# Patient Record
Sex: Male | Born: 1950 | Race: White | Hispanic: No | Marital: Married | Smoking: Never smoker
Health system: Southern US, Community
[De-identification: ages and names within clinical notes are randomized; demographics above are authoritative.]

## PROBLEM LIST (undated history)

## (undated) DIAGNOSIS — Z974 Presence of external hearing-aid: Secondary | ICD-10-CM

## (undated) DIAGNOSIS — E559 Vitamin D deficiency, unspecified: Secondary | ICD-10-CM

## (undated) DIAGNOSIS — G250 Essential tremor: Secondary | ICD-10-CM

## (undated) DIAGNOSIS — K4020 Bilateral inguinal hernia, without obstruction or gangrene, not specified as recurrent: Secondary | ICD-10-CM

## (undated) DIAGNOSIS — J3489 Other specified disorders of nose and nasal sinuses: Secondary | ICD-10-CM

## (undated) DIAGNOSIS — D510 Vitamin B12 deficiency anemia due to intrinsic factor deficiency: Secondary | ICD-10-CM

## (undated) DIAGNOSIS — E039 Hypothyroidism, unspecified: Secondary | ICD-10-CM

## (undated) DIAGNOSIS — Z973 Presence of spectacles and contact lenses: Secondary | ICD-10-CM

## (undated) DIAGNOSIS — I10 Essential (primary) hypertension: Secondary | ICD-10-CM

## (undated) DIAGNOSIS — J309 Allergic rhinitis, unspecified: Secondary | ICD-10-CM

## (undated) DIAGNOSIS — E538 Deficiency of other specified B group vitamins: Secondary | ICD-10-CM

## (undated) DIAGNOSIS — M1990 Unspecified osteoarthritis, unspecified site: Secondary | ICD-10-CM

## (undated) DIAGNOSIS — E785 Hyperlipidemia, unspecified: Secondary | ICD-10-CM

## (undated) DIAGNOSIS — M25511 Pain in right shoulder: Secondary | ICD-10-CM

## (undated) DIAGNOSIS — R202 Paresthesia of skin: Secondary | ICD-10-CM

## (undated) HISTORY — PX: CERVICAL SPINE SURGERY: SHX589

## (undated) HISTORY — DX: Hyperlipidemia, unspecified: E78.5

## (undated) HISTORY — DX: Vitamin B12 deficiency anemia due to intrinsic factor deficiency: D51.0

## (undated) HISTORY — DX: Unspecified osteoarthritis, unspecified site: M19.90

## (undated) HISTORY — DX: Allergic rhinitis, unspecified: J30.9

## (undated) HISTORY — PX: NASAL SINUS SURGERY: SHX719

## (undated) HISTORY — PX: LUMBAR SPINE SURGERY: SHX701

## (undated) HISTORY — DX: Paresthesia of skin: R20.2

## (undated) HISTORY — DX: Essential tremor: G25.0

## (undated) HISTORY — DX: Vitamin D deficiency, unspecified: E55.9

## (undated) HISTORY — DX: Deficiency of other specified B group vitamins: E53.8

## (undated) HISTORY — DX: Hypothyroidism, unspecified: E03.9

---

## 2002-01-01 ENCOUNTER — Ambulatory Visit (HOSPITAL_COMMUNITY): Admission: RE | Admit: 2002-01-01 | Discharge: 2002-01-01 | Payer: Self-pay | Admitting: *Deleted

## 2002-01-01 ENCOUNTER — Encounter: Payer: Self-pay | Admitting: *Deleted

## 2003-12-04 ENCOUNTER — Ambulatory Visit (HOSPITAL_COMMUNITY): Admission: RE | Admit: 2003-12-04 | Discharge: 2003-12-04 | Payer: Self-pay | Admitting: Family Medicine

## 2012-12-26 ENCOUNTER — Ambulatory Visit (INDEPENDENT_AMBULATORY_CARE_PROVIDER_SITE_OTHER): Payer: BC Managed Care – PPO | Admitting: Neurology

## 2012-12-26 ENCOUNTER — Encounter: Payer: Self-pay | Admitting: Neurology

## 2012-12-26 VITALS — BP 155/97 | HR 84 | Ht 73.5 in | Wt 174.0 lb

## 2012-12-26 DIAGNOSIS — F079 Unspecified personality and behavioral disorder due to known physiological condition: Secondary | ICD-10-CM | POA: Insufficient documentation

## 2012-12-26 DIAGNOSIS — G252 Other specified forms of tremor: Secondary | ICD-10-CM | POA: Insufficient documentation

## 2012-12-26 DIAGNOSIS — G250 Essential tremor: Secondary | ICD-10-CM

## 2012-12-26 MED ORDER — TOPIRAMATE 25 MG PO TABS: 25.0000 mg | ORAL_TABLET | Freq: Two times a day (BID) | ORAL | Status: DC

## 2012-12-26 NOTE — Progress Notes (Signed)
   Reason for visit: Tremor  Christian Coleman is an 62 y.o. male  History of present illness:  Christian Coleman is a 62 year old right-handed white male with a history of a benign essential tremor. The patient has been on Mysoline, but he is having troubles with dryness of the mouth, headaches, and constipation. The medication has helped the tremors slightly, but he is still having a lot of issues with tremor. The patient is having some drowsiness during the day when he is inactive. The patient also notes some slight gait imbalance. The patient returns to the office today for an evaluation. The patient has not noted any significant new medical issues, but he recently had a severe sinus infection. The patient is concerned that the medication may be causing some of these problems.   Past Medical History  Diagnosis Date  . Tremors of nervous system     Benign  . Hypothyroidism   . Vitamin B12 deficiency   . Vitamin D deficiency   . Auditory acuity evaluation     Decreased  . Hearing aid worn   . Dyslipidemia     Past Surgical History  Procedure Laterality Date  . Cervical spine surgery    . Lumbar spine surgery    . Nasal sinus surgery      Family History  Problem Relation Age of Onset  . Tremor Mother   . Heart disease Mother   . Cancer - Other Father     Throat  . Tremor Maternal Grandfather     Social history:  reports that he has never smoked. He does not have any smokeless tobacco history on file. He reports that he does not drink alcohol or use illicit drugs.  Allergies:  Allergies  Allergen Reactions  . Propranolol     Dizzy    Medications:  No current outpatient prescriptions on file prior to visit.   No current facility-administered medications on file prior to visit.    ROS:  Out of a complete 14 system review of symptoms, the patient complains only of the following symptoms, and all other reviewed systems are negative.  Tremor Sleepiness  Blood  pressure 155/97, pulse 84, height 6' 1.5" (1.867 m), weight 174 lb (78.926 kg).  Physical Exam  General: The patient is alert and cooperative at the time of the examination.  Skin: No significant peripheral edema is noted.   Neurologic Exam  Cranial nerves: Facial symmetry is present. Speech is normal, no aphasia or dysarthria is noted. Extraocular movements are full. Visual fields are full. The patient has some problems with a head and neck tremor.  Motor: The patient has good strength in all 4 extremities.  Coordination: The patient has good finger-nose-finger and heel-to-shin bilaterally. The patient has intention tremors with finger nose finger.  Gait and station: The patient has a normal gait. Tandem gait is normal. Romberg is negative. No drift is seen.  Reflexes: Deep tendon reflexes are symmetric.   Assessment/Plan:   One. Benign essential tremor  The patient is not tolerating a fairly low dose of Mysoline. The patient is on 150 mg at night. The patient will be switched to Topamax. The patient will taper off of the Mysoline. The patient will followup in 4 months. The patient will contact me if he has side effects on the new medication.  Marlan Palau MD 12/26/2012 8:45 PM  [facility] [address] [place], [place] [address]  Phone [phone] Fax [phone]

## 2012-12-26 NOTE — Patient Instructions (Signed)
  Topamax take one tablet at night for one week, then take one tablet twice a day. Taper off of the primidone.

## 2013-01-08 ENCOUNTER — Telehealth: Payer: Self-pay | Admitting: *Deleted

## 2013-01-08 MED ORDER — CLONAZEPAM 0.5 MG PO TABS: ORAL_TABLET | ORAL | Status: DC

## 2013-01-08 NOTE — Telephone Encounter (Signed)
I called patient. The patient is not tolerating the Topamax, he is to stop the drug. The patient will be started on low-dose clonazepam at 0.5 mg daily for 2 weeks, and then go to 1 tablet twice daily.

## 2013-01-08 NOTE — Telephone Encounter (Signed)
Patient called stating he took Topamax for 8 days but had to stop due to the side effects such as: tiredness, sleepiness on the job and unable to concrentrate.

## 2013-02-20 ENCOUNTER — Telehealth: Payer: Self-pay | Admitting: Neurology

## 2013-02-20 NOTE — Telephone Encounter (Signed)
Patient called stating he has taken himself off Clonazepam because he wants to try something natural because of the side effects. Patient will start taking Magnesium 300 mg

## 2013-02-20 NOTE — Telephone Encounter (Signed)
Noted. I did not call the patient back.

## 2013-05-16 ENCOUNTER — Ambulatory Visit: Payer: BC Managed Care – PPO | Admitting: Neurology

## 2016-01-11 ENCOUNTER — Telehealth: Payer: Self-pay | Admitting: Family Medicine

## 2016-01-11 ENCOUNTER — Ambulatory Visit (INDEPENDENT_AMBULATORY_CARE_PROVIDER_SITE_OTHER): Payer: 59 | Admitting: Family Medicine

## 2016-01-11 VITALS — BP 130/88 | HR 61 | Temp 98.4°F | Resp 18 | Ht 73.25 in | Wt 176.6 lb

## 2016-01-11 DIAGNOSIS — W57XXXA Bitten or stung by nonvenomous insect and other nonvenomous arthropods, initial encounter: Secondary | ICD-10-CM | POA: Diagnosis not present

## 2016-01-11 DIAGNOSIS — S30861A Insect bite (nonvenomous) of abdominal wall, initial encounter: Secondary | ICD-10-CM | POA: Diagnosis not present

## 2016-01-11 NOTE — Patient Instructions (Addendum)
IF you received an x-ray today, you will receive an invoice from [facility]. Please contact [facility] at [phone] with questions or concerns regarding your invoice.   IF you received labwork today, you will receive an invoice from [facility]. Please contact [facility] at [phone] with questions or concerns regarding your invoice.   Our billing staff will not be able to assist you with questions regarding bills from these companies.  You will be contacted with the lab results as soon as they are available. The fastest way to get your results is to activate your My Chart account. Instructions are located on the last page of this paperwork. If you have not heard from us regarding the results in 2 weeks, please contact this office.     I do not see any rash or concerning findings from where the tick was attached. If any fever, new rashes, or other new symptoms, be seen right away. See information below on Tenex. Continue to check body once per day if outside or around ticks. Return to the clinic or go to the nearest emergency room if any of your symptoms worsen or new symptoms occur.  Tick Bite Information Ticks are insects that attach themselves to the skin and draw blood for food. There are various types of ticks. Common types include wood ticks and deer ticks. Most ticks live in shrubs and grassy areas. Ticks can climb onto your body when you make contact with leaves or grass where the tick is waiting. The most common places on the body for ticks to attach themselves are the scalp, neck, armpits, waist, and groin. Most tick bites are harmless, but sometimes ticks carry germs that cause diseases. These germs can be spread to a person during the tick's feeding process. The chance of a disease spreading through a tick bite depends on:   The type of tick.  Time of year.   How long the tick is attached.   Geographic location.  HOW CAN  YOU PREVENT TICK BITES? Take these steps to help prevent tick bites when you are outdoors:  Wear protective clothing. Long sleeves and long pants are best.   Wear white clothes so you can see ticks more easily.  Tuck your pant legs into your socks.   If walking on a trail, stay in the middle of the trail to avoid brushing against bushes.  Avoid walking through areas with long grass.  Put insect repellent on all exposed skin and along boot tops, pant legs, and sleeve cuffs.   Check clothing, hair, and skin repeatedly and before going inside.   Brush off any ticks that are not attached.  Take a shower or bath as soon as possible after being outdoors.  WHAT IS THE PROPER WAY TO REMOVE A TICK? Ticks should be removed as soon as possible to help prevent diseases caused by tick bites. 1. If latex gloves are available, put them on before trying to remove a tick.  2. Using fine-point tweezers, grasp the tick as close to the skin as possible. You may also use curved forceps or a tick removal tool. Grasp the tick as close to its head as possible. Avoid grasping the tick on its body. 3. Pull gently with steady upward pressure until the tick lets go. Do not twist the tick or jerk it suddenly. This may break off the tick's head or mouth parts. 4. Do not squeeze or crush the tick's body. This could force  disease-carrying fluids from the tick into your body.  5. After the tick is removed, wash the bite area and your hands with soap and water or other disinfectant such as alcohol. 6. Apply a small amount of antiseptic cream or ointment to the bite site.  7. Wash and disinfect any instruments that were used.  Do not try to remove a tick by applying a hot match, petroleum jelly, or fingernail polish to the tick. These methods do not work and may increase the chances of disease being spread from the tick bite.  WHEN SHOULD YOU SEEK MEDICAL CARE? Contact your health care provider if you are  unable to remove a tick from your skin or if a part of the tick breaks off and is stuck in the skin.  After a tick bite, you need to be aware of signs and symptoms that could be related to diseases spread by ticks. Contact your health care provider if you develop any of the following in the days or weeks after the tick bite:  Unexplained fever.  Rash. A circular rash that appears days or weeks after the tick bite may indicate the possibility of Lyme disease. The rash may resemble a target with a bull's-eye and may occur at a different part of your body than the tick bite.  Redness and swelling in the area of the tick bite.   Tender, swollen lymph glands.   Diarrhea.   Weight loss.   Cough.   Fatigue.   Muscle, joint, or bone pain.   Abdominal pain.   Headache.   Lethargy or a change in your level of consciousness.  Difficulty walking or moving your legs.   Numbness in the legs.   Paralysis.  Shortness of breath.   Confusion.   Repeated vomiting.    This information is not intended to replace advice given to you by your health care provider. Make sure you discuss any questions you have with your health care provider.   Document Released: 09/02/2000 Document Revised: 09/26/2014 Document Reviewed: 02/13/2013 Elsevier Interactive Patient Education [employer]

## 2016-01-11 NOTE — Telephone Encounter (Signed)
Left message for patient that I will send his after visit summary, or he can return to pick it up if he would like.

## 2016-01-11 NOTE — Progress Notes (Signed)
By signing my name below I, Shelah Lewandowsky, attest that this documentation has been prepared under the direction and in the presence of Shade Flood, MD. Electonically Signed. Shelah Lewandowsky, Scribe 01/11/2016 at 11:14 AM  Subjective:    Patient ID: Christian Coleman, male    DOB: 1951-07-12, 65 y.o.   MRN: [id number]  Chief Complaint  Patient presents with  . Tick Removal    left side in groin area, noticed this morning.     HPI Christian Coleman is a 65 y.o. male who presents to the [facility] complaining of partial tick head still embedded in his upper left thigh. Pt states he noticed a small tick on his upper left thigh this morning and pulled it off and noticed a small black dot where the tick had been. Pt denies any rash, fever, or chills. Pt states that he was out in the woods 2 days ago and found multiple ticks on a family member 2 days ago after the trip into the woods.  Pt states that he showers daily and states he definitely did not have the tick on him yesterday.    Patient Active Problem List   Diagnosis Date Noted  . 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 Essential and other specified forms of tremor 12/26/2012   Past Medical History  Diagnosis Date  . Tremors of nervous system     Benign  . Hypothyroidism   . Vitamin B12 deficiency   . Vitamin D deficiency   . Auditory acuity evaluation     Decreased  . Hearing aid worn   . Dyslipidemia   . Allergy   . Arthritis    Past Surgical History  Procedure Laterality Date  . Cervical spine surgery    . Lumbar spine surgery    . Nasal sinus surgery    . Spine surgery     Allergies  Allergen Reactions  . Propranolol     Dizzy   Prior to Admission medications   Medication Sig Start Date End Date Taking? Authorizing Provider  aspirin 81 MG tablet Take 81 mg by mouth daily.   Yes Historical Provider, MD  atorvastatin (LIPITOR) 20 MG tablet  Take 20 mg by mouth daily. 12/02/12  Yes Historical Provider, MD  Cholecalciferol (VITAMIN D3) 1000 UNITS CAPS Take 1,000 capsules by mouth 2 (two) times daily.   Yes Historical Provider, MD  Coenzyme Q10 (COQ-10 PO) Take by mouth.   Yes Historical Provider, MD  levothyroxine (SYNTHROID, LEVOTHROID) 175 MCG tablet Take 175 mcg by mouth daily. 12/04/12  Yes Historical Provider, MD  vitamin B-12 (CYANOCOBALAMIN) 1000 MCG tablet Place 1,000 mcg under the tongue daily.   Yes Historical Provider, MD   Social History   Social History  . Marital Status: Married    Spouse Name: N/A  . Number of Children: N/A  . Years of Education: N/A   Occupational History  . Not on file.   Social History Main Topics  . Smoking status: Never Smoker   . Smokeless tobacco: Not on file  . Alcohol Use: No  . Drug Use: No  . Sexual Activity: Not on file   Other Topics Concern  . Not on file   Social History Narrative      Review of Systems  Constitutional: Negative for fever and chills.  Skin: Negative for rash.       Objective:   Physical Exam  Constitutional: He is  oriented to person, place, and time. He appears well-developed and well-nourished. No distress.  HENT:  Head: Normocephalic and atraumatic.  Eyes: Conjunctivae are normal. Pupils are equal, round, and reactive to light.  Neck: Neck supple.  Cardiovascular: Normal rate.   Pulmonary/Chest: Effort normal.  Musculoskeletal: Normal range of motion.  Neurological: He is alert and oriented to person, place, and time. Gait normal.  Skin: Skin is warm and dry. Abrasion (In the left lower abd anterior hip area there is a small abrasion and dried blood centrally from tick removal.) noted.  There is no surrounding erythema, no discharge, and no swelling around the abrasion in the left lower abd anterior hip area.  Psychiatric: He has a normal mood and affect. His behavior is normal.  Nursing note and vitals reviewed.    Filed Vitals:    01/11/16 1020  BP: 130/88  Pulse: 61  Temp: 98.4 F (36.9 C)  TempSrc: Oral  Resp: 18  Height: 6' 1.25" (1.861 m)  Weight: 176 lb 9.6 oz (80.105 kg)  SpO2: 97%        Assessment & Plan:    Christian Coleman is a 65 y.o. male Tick bite of abdomen, initial encounter  - On close exam, appears that all tick parts have been removed. Second exam was provided by other provider. As tick was attached for less than 48 hours, unlikely to have disease transmission. No rash, no other concerning findings on history or exam. RTC precautions discussed if fever, rash, or other new symptoms.  Meds ordered this encounter  Medications  . Coenzyme Q10 (COQ-10 PO)    Sig: Take by mouth.   Patient Instructions       IF you received an x-ray today, you will receive an invoice from [facility]. Please contact [facility] at [phone] with questions or concerns regarding your invoice.   IF you received labwork today, you will receive an invoice from [facility]. Please contact [facility] at [phone] with questions or concerns regarding your invoice.   Our billing staff will not be able to assist you with questions regarding bills from these companies.  You will be contacted with the lab results as soon as they are available. The fastest way to get your results is to activate your My Chart account. Instructions are located on the last page of this paperwork. If you have not heard from us regarding the results in 2 weeks, please contact this office.     I do not see any rash or concerning findings from where the tick was attached. If any fever, new rashes, or other new symptoms, be seen right away. See information below on Tenex. Continue to check body once per day if outside or around ticks. Return to the clinic or go to the nearest emergency room if any of your symptoms worsen or new symptoms occur.  Tick Bite Information Ticks are insects that  attach themselves to the skin and draw blood for food. There are various types of ticks. Common types include wood ticks and deer ticks. Most ticks live in shrubs and grassy areas. Ticks can climb onto your body when you make contact with leaves or grass where the tick is waiting. The most common places on the body for ticks to attach themselves are the scalp, neck, armpits, waist, and groin. Most tick bites are harmless, but sometimes ticks carry germs that cause diseases. These germs can be spread to a person during the tick's feeding process. The  chance of a disease spreading through a tick bite depends on:   The type of tick.  Time of year.   How long the tick is attached.   Geographic location.  HOW CAN YOU PREVENT TICK BITES? Take these steps to help prevent tick bites when you are outdoors:  Wear protective clothing. Long sleeves and long pants are best.   Wear white clothes so you can see ticks more easily.  Tuck your pant legs into your socks.   If walking on a trail, stay in the middle of the trail to avoid brushing against bushes.  Avoid walking through areas with long grass.  Put insect repellent on all exposed skin and along boot tops, pant legs, and sleeve cuffs.   Check clothing, hair, and skin repeatedly and before going inside.   Brush off any ticks that are not attached.  Take a shower or bath as soon as possible after being outdoors.  WHAT IS THE PROPER WAY TO REMOVE A TICK? Ticks should be removed as soon as possible to help prevent diseases caused by tick bites. 1. If latex gloves are available, put them on before trying to remove a tick.  2. Using fine-point tweezers, grasp the tick as close to the skin as possible. You may also use curved forceps or a tick removal tool. Grasp the tick as close to its head as possible. Avoid grasping the tick on its body. 3. Pull gently with steady upward pressure until the tick lets go. Do not twist the tick or jerk  it suddenly. This may break off the tick's head or mouth parts. 4. Do not squeeze or crush the tick's body. This could force disease-carrying fluids from the tick into your body.  5. After the tick is removed, wash the bite area and your hands with soap and water or other disinfectant such as alcohol. 6. Apply a small amount of antiseptic cream or ointment to the bite site.  7. Wash and disinfect any instruments that were used.  Do not try to remove a tick by applying a hot match, petroleum jelly, or fingernail polish to the tick. These methods do not work and may increase the chances of disease being spread from the tick bite.  WHEN SHOULD YOU SEEK MEDICAL CARE? Contact your health care provider if you are unable to remove a tick from your skin or if a part of the tick breaks off and is stuck in the skin.  After a tick bite, you need to be aware of signs and symptoms that could be related to diseases spread by ticks. Contact your health care provider if you develop any of the following in the days or weeks after the tick bite:  Unexplained fever.  Rash. A circular rash that appears days or weeks after the tick bite may indicate the possibility of Lyme disease. The rash may resemble a target with a bull's-eye and may occur at a different part of your body than the tick bite.  Redness and swelling in the area of the tick bite.   Tender, swollen lymph glands.   Diarrhea.   Weight loss.   Cough.   Fatigue.   Muscle, joint, or bone pain.   Abdominal pain.   Headache.   Lethargy or a change in your level of consciousness.  Difficulty walking or moving your legs.   Numbness in the legs.   Paralysis.  Shortness of breath.   Confusion.   Repeated vomiting.    This  information is not intended to replace advice given to you by your health care provider. Make sure you discuss any questions you have with your health care provider.   Document Released: 09/02/2000  Document Revised: 09/26/2014 Document Reviewed: 02/13/2013 Elsevier Interactive Patient Education [employer]     I personally performed the services described in this documentation, which was scribed in my presence. The recorded information has been reviewed and considered, and addended by me as needed.

## 2016-06-07 DIAGNOSIS — R202 Paresthesia of skin: Secondary | ICD-10-CM | POA: Diagnosis not present

## 2016-06-07 DIAGNOSIS — G250 Essential tremor: Secondary | ICD-10-CM | POA: Diagnosis not present

## 2016-06-07 DIAGNOSIS — E559 Vitamin D deficiency, unspecified: Secondary | ICD-10-CM | POA: Diagnosis not present

## 2016-06-07 DIAGNOSIS — I10 Essential (primary) hypertension: Secondary | ICD-10-CM | POA: Diagnosis not present

## 2016-06-07 DIAGNOSIS — M25511 Pain in right shoulder: Secondary | ICD-10-CM | POA: Diagnosis not present

## 2016-06-07 DIAGNOSIS — J309 Allergic rhinitis, unspecified: Secondary | ICD-10-CM | POA: Diagnosis not present

## 2016-06-07 DIAGNOSIS — D510 Vitamin B12 deficiency anemia due to intrinsic factor deficiency: Secondary | ICD-10-CM | POA: Diagnosis not present

## 2016-06-07 DIAGNOSIS — E039 Hypothyroidism, unspecified: Secondary | ICD-10-CM | POA: Diagnosis not present

## 2016-06-07 DIAGNOSIS — E782 Mixed hyperlipidemia: Secondary | ICD-10-CM | POA: Diagnosis not present

## 2016-06-07 DIAGNOSIS — B070 Plantar wart: Secondary | ICD-10-CM | POA: Diagnosis not present

## 2016-06-08 ENCOUNTER — Ambulatory Visit (INDEPENDENT_AMBULATORY_CARE_PROVIDER_SITE_OTHER): Payer: Medicare HMO | Admitting: Podiatry

## 2016-06-08 DIAGNOSIS — B070 Plantar wart: Secondary | ICD-10-CM

## 2016-06-08 DIAGNOSIS — M79671 Pain in right foot: Secondary | ICD-10-CM | POA: Diagnosis not present

## 2016-06-08 NOTE — Progress Notes (Signed)
   Subjective:    Patient ID: Christian Coleman, male    DOB: 11-19-1950, 65 y.o.   MRN: [id number]  HPI    Review of Systems  All other systems reviewed and are negative.      Objective:   Physical Exam        Assessment & Plan:

## 2016-06-08 NOTE — Patient Instructions (Signed)
Plantar Warts Warts are small growths on the skin. They can occur on various areas of the body. When they occur on the underside (sole) of the foot, they are called plantar warts. Plantar warts often occur in groups, with several small warts around a larger growth. They tend to develop over areas of pressure, such as the heel or the ball of the foot. Most warts are not painful, and they usually do not cause problems. However, plantar warts may cause pain when you walk because pressure is applied to them. Warts often go away on their own in time. Various treatments may be done if needed. Sometimes, warts go away and then they come back again. CAUSES Plantar warts are caused by a type of virus that is called human papillomavirus (HPV). HPV attacks a break in the skin of the foot. Walking barefoot can lead to exposure to the virus. These warts may spread to other areas of the sole. They spread to other areas of the body only through direct contact. RISK FACTORS Plantar warts are more likely to develop in:  People who are 10-20 years of age.  People who use public showers or locker rooms.  People who have a weakened body defense system (immune system). SYMPTOMS Plantar warts may be flat or slightly raised. They may grow into the deeper layers of skin or rise above the surface of the skin. Most plantar warts have a rough surface. They may cause pain when you use your foot to support your body weight. DIAGNOSIS A plantar wart can usually be diagnosed from its appearance. In some cases, a tissue sample may be removed (biopsy) to be looked at under a microscope. TREATMENT In many cases, warts do not need treatment. Without treatment, they often go away over a period of many months to a couple years. If treatment is needed, options may include:  Applying medicated solutions, creams, or patches to the wart. These may be over-the-counter or prescription medicines that make the skin soft so that layers will  gradually shed away. In many cases, the medicine is applied one or two times per day and covered with a bandage.  Putting duct tape over the top of the wart (occlusion). You will leave the tape in place for as long as told by your health care provider, then you will replace it with a new strip of tape. This is done until the wart goes away.  Freezing the wart with liquid nitrogen (cryotherapy).  Burning the wart with:  Laser treatment.  An electrified probe (electrocautery).  Injection of a medicine (Candida antigen) into the wart to help the body's immune system to fight off the wart.  Surgery to remove the wart. HOME CARE INSTRUCTIONS  Apply medicated creams or solutions only as told by your health care provider. This may involve:  Soaking the affected area in warm water.  Removing the top layer of softened skin before you apply the medicine. A pumice stone works well for removing the tissue.  Applying a bandage over the affected area after you apply the medicine.  Repeating the process daily or as told by your health care provider.  Do not scratch or pick at a wart.  Wash your hands after you touch a wart.  If a wart is painful, try applying a bandage with a hole in the middle over the wart. The helps to take pressure off the wart.  Keep all follow-up visits as told by your health care provider. This is important. PREVENTION   Take these actions to help prevent warts:  Wear shoes and socks. Change your socks daily.  Keep your feet clean and dry.  Check your feet regularly.  Avoid direct contact with warts on other people. SEEK MEDICAL CARE IF:  Your warts do not improve after treatment.  You have redness, swelling, or pain at the site of a wart.  You have bleeding from a wart that does not stop with light pressure.  You have diabetes and you develop a wart.   This information is not intended to replace advice given to you by your health care provider. Make sure  you discuss any questions you have with your health care provider.   Document Released: 11/26/2003 Document Revised: 05/27/2015 Document Reviewed: 12/01/2014 Elsevier Interactive Patient Education 2016 [employer]  

## 2016-06-12 NOTE — Progress Notes (Signed)
Patient ID: Christian Coleman, male   DOB: July 08, 1951, 65 y.o.   MRN: [id number] Subjective: Patient presents today with pain and tenderness on the plantar aspect of the right foot secondary to a plantars wart. Patient states that the pain has been present for several weeks now. Patient denies trauma.  Objective: Physical Exam General: The patient is alert and oriented x3 in no acute distress.  Dermatology: Hyperkeratotic skin lesion noted to the plantar aspect of the right foot approximately 1 cm in diameter.. Pinpoint bleeding noted upon debridement. Skin is warm, dry and supple bilateral lower extremities. Negative for open lesions or macerations.  Vascular: Palpable pedal pulses bilaterally. No edema or erythema noted. Capillary refill within normal limits.  Neurological: Epicritic and protective threshold grossly intact bilaterally.   Musculoskeletal Exam: Pain on palpation to the note skin lesion.  Range of motion within normal limits to all pedal and ankle joints bilateral. Muscle strength 5/5 in all groups bilateral.   Assessment: #1 plantar wart right foot #2 pain in right foot  Problem List Items Addressed This Visit    None    Visit Diagnoses   None.       Plan of Care:  #1 Patient was evaluated. #2 Excisional debridement of the plantar wart lesion was performed using a chisel blade. Cantharone was applied and the lesion was dressed with a dry sterile dressing. #3 patient is to return to clinic in 1 week.  Next visit 17110     Dr. Felecia ShellingBrent M. Latarshia Jersey, DPM [facility]

## 2016-06-16 ENCOUNTER — Ambulatory Visit (INDEPENDENT_AMBULATORY_CARE_PROVIDER_SITE_OTHER): Payer: Medicare HMO | Admitting: Podiatry

## 2016-06-16 DIAGNOSIS — B070 Plantar wart: Secondary | ICD-10-CM

## 2016-06-16 DIAGNOSIS — M79671 Pain in right foot: Secondary | ICD-10-CM

## 2016-06-18 NOTE — Progress Notes (Signed)
Subjective: Patient presents today for follow-up evaluation of a painful plantar wart.Patient states that he's doing well. Cantharone was applied on the last visit.  Objective: Physical Exam General: The patient is alert and oriented x3 in no acute distress.  Dermatology: Hyperkeratotic skin lesion noted to the plantar aspect of the foot approximately 1 cm in diameter. Skin is warm, dry and supple bilateral lower extremities. Negative for open lesions or macerations.  Vascular: Palpable pedal pulses bilaterally. No edema or erythema noted. Capillary refill within normal limits.  Neurological: Epicritic and protective threshold grossly intact bilaterally.   Musculoskeletal Exam: Pain on palpation to the note skin lesion.  Range of motion within normal limits to all pedal and ankle joints bilateral. Muscle strength 5/5 in all groups bilateral.   Assessment: #1 plantar wart right foot #2 pain in right foot   Plan of Care:  #1 Patient was evaluated. #2 Excisional debridement of the plantar wart lesion was performed using a chisel blade. Salicylic acid was applied and the lesion was dressed with a dry sterile dressing. #3 patient is to return to clinic when necessary    Dr. Felecia ShellingBrent M. Kirstie Larsen, DPM [facility]

## 2016-08-16 DIAGNOSIS — M7581 Other shoulder lesions, right shoulder: Secondary | ICD-10-CM | POA: Diagnosis not present

## 2016-08-17 ENCOUNTER — Other Ambulatory Visit: Payer: Self-pay | Admitting: Family Medicine

## 2016-08-17 DIAGNOSIS — M7581 Other shoulder lesions, right shoulder: Secondary | ICD-10-CM

## 2016-08-19 ENCOUNTER — Ambulatory Visit
Admission: RE | Admit: 2016-08-19 | Discharge: 2016-08-19 | Disposition: A | Discharge status: Home / Self Care | Payer: Medicare HMO | Source: Ambulatory Visit | Attending: Family Medicine | Admitting: Family Medicine

## 2016-08-19 ENCOUNTER — Other Ambulatory Visit: Payer: Self-pay | Admitting: Family Medicine

## 2016-08-19 DIAGNOSIS — M25511 Pain in right shoulder: Secondary | ICD-10-CM

## 2016-08-21 ENCOUNTER — Inpatient Hospital Stay: Admission: RE | Admit: 2016-08-21 | Payer: Self-pay | Source: Ambulatory Visit

## 2016-08-26 ENCOUNTER — Ambulatory Visit
Admission: RE | Admit: 2016-08-26 | Discharge: 2016-08-26 | Disposition: A | Discharge status: Home / Self Care | Payer: Medicare HMO | Source: Ambulatory Visit | Attending: Family Medicine | Admitting: Family Medicine

## 2016-08-26 DIAGNOSIS — M7581 Other shoulder lesions, right shoulder: Secondary | ICD-10-CM

## 2016-08-26 DIAGNOSIS — M25511 Pain in right shoulder: Secondary | ICD-10-CM | POA: Diagnosis not present

## 2016-08-29 DIAGNOSIS — J309 Allergic rhinitis, unspecified: Secondary | ICD-10-CM | POA: Diagnosis not present

## 2016-08-29 DIAGNOSIS — G250 Essential tremor: Secondary | ICD-10-CM | POA: Diagnosis not present

## 2016-08-29 DIAGNOSIS — E559 Vitamin D deficiency, unspecified: Secondary | ICD-10-CM | POA: Diagnosis not present

## 2016-08-29 DIAGNOSIS — Z23 Encounter for immunization: Secondary | ICD-10-CM | POA: Diagnosis not present

## 2016-08-29 DIAGNOSIS — I10 Essential (primary) hypertension: Secondary | ICD-10-CM | POA: Diagnosis not present

## 2016-08-29 DIAGNOSIS — M7581 Other shoulder lesions, right shoulder: Secondary | ICD-10-CM | POA: Diagnosis not present

## 2016-08-29 DIAGNOSIS — E039 Hypothyroidism, unspecified: Secondary | ICD-10-CM | POA: Diagnosis not present

## 2016-08-29 DIAGNOSIS — E782 Mixed hyperlipidemia: Secondary | ICD-10-CM | POA: Diagnosis not present

## 2016-08-29 DIAGNOSIS — D510 Vitamin B12 deficiency anemia due to intrinsic factor deficiency: Secondary | ICD-10-CM | POA: Diagnosis not present

## 2016-08-30 DIAGNOSIS — Z6824 Body mass index (BMI) 24.0-24.9, adult: Secondary | ICD-10-CM | POA: Diagnosis not present

## 2016-08-30 DIAGNOSIS — E039 Hypothyroidism, unspecified: Secondary | ICD-10-CM | POA: Diagnosis not present

## 2016-08-30 DIAGNOSIS — E785 Hyperlipidemia, unspecified: Secondary | ICD-10-CM | POA: Diagnosis not present

## 2016-08-30 DIAGNOSIS — I10 Essential (primary) hypertension: Secondary | ICD-10-CM | POA: Diagnosis not present

## 2016-08-30 DIAGNOSIS — Z0000 Encounter for general adult medical examination without abnormal findings: Secondary | ICD-10-CM | POA: Diagnosis not present

## 2016-09-02 DIAGNOSIS — M19011 Primary osteoarthritis, right shoulder: Secondary | ICD-10-CM | POA: Diagnosis not present

## 2016-09-02 DIAGNOSIS — M67911 Unspecified disorder of synovium and tendon, right shoulder: Secondary | ICD-10-CM | POA: Diagnosis not present

## 2016-09-30 DIAGNOSIS — M19011 Primary osteoarthritis, right shoulder: Secondary | ICD-10-CM | POA: Diagnosis not present

## 2016-10-14 DIAGNOSIS — R0981 Nasal congestion: Secondary | ICD-10-CM | POA: Diagnosis not present

## 2016-10-18 DIAGNOSIS — M19011 Primary osteoarthritis, right shoulder: Secondary | ICD-10-CM | POA: Diagnosis not present

## 2016-10-18 DIAGNOSIS — M75111 Incomplete rotator cuff tear or rupture of right shoulder, not specified as traumatic: Secondary | ICD-10-CM | POA: Diagnosis not present

## 2016-10-18 DIAGNOSIS — M24111 Other articular cartilage disorders, right shoulder: Secondary | ICD-10-CM | POA: Diagnosis not present

## 2016-10-18 DIAGNOSIS — G8918 Other acute postprocedural pain: Secondary | ICD-10-CM | POA: Diagnosis not present

## 2016-10-18 DIAGNOSIS — M94211 Chondromalacia, right shoulder: Secondary | ICD-10-CM | POA: Diagnosis not present

## 2016-10-18 DIAGNOSIS — S43491D Other sprain of right shoulder joint, subsequent encounter: Secondary | ICD-10-CM | POA: Diagnosis not present

## 2016-10-18 DIAGNOSIS — M7541 Impingement syndrome of right shoulder: Secondary | ICD-10-CM | POA: Diagnosis not present

## 2016-10-18 HISTORY — PX: SHOULDER OPEN ROTATOR CUFF REPAIR: SHX2407

## 2016-10-26 DIAGNOSIS — Z9889 Other specified postprocedural states: Secondary | ICD-10-CM | POA: Diagnosis not present

## 2016-10-27 DIAGNOSIS — M25511 Pain in right shoulder: Secondary | ICD-10-CM | POA: Diagnosis not present

## 2016-10-27 DIAGNOSIS — M25611 Stiffness of right shoulder, not elsewhere classified: Secondary | ICD-10-CM | POA: Diagnosis not present

## 2016-11-01 DIAGNOSIS — M25511 Pain in right shoulder: Secondary | ICD-10-CM | POA: Diagnosis not present

## 2016-11-01 DIAGNOSIS — M25611 Stiffness of right shoulder, not elsewhere classified: Secondary | ICD-10-CM | POA: Diagnosis not present

## 2016-11-03 DIAGNOSIS — M25511 Pain in right shoulder: Secondary | ICD-10-CM | POA: Diagnosis not present

## 2016-11-03 DIAGNOSIS — M25611 Stiffness of right shoulder, not elsewhere classified: Secondary | ICD-10-CM | POA: Diagnosis not present

## 2016-11-07 DIAGNOSIS — M25611 Stiffness of right shoulder, not elsewhere classified: Secondary | ICD-10-CM | POA: Diagnosis not present

## 2016-11-07 DIAGNOSIS — M25511 Pain in right shoulder: Secondary | ICD-10-CM | POA: Diagnosis not present

## 2016-11-10 DIAGNOSIS — M25611 Stiffness of right shoulder, not elsewhere classified: Secondary | ICD-10-CM | POA: Diagnosis not present

## 2016-11-10 DIAGNOSIS — M25511 Pain in right shoulder: Secondary | ICD-10-CM | POA: Diagnosis not present

## 2016-11-14 DIAGNOSIS — M25611 Stiffness of right shoulder, not elsewhere classified: Secondary | ICD-10-CM | POA: Diagnosis not present

## 2016-11-14 DIAGNOSIS — M25511 Pain in right shoulder: Secondary | ICD-10-CM | POA: Diagnosis not present

## 2016-11-16 DIAGNOSIS — M25511 Pain in right shoulder: Secondary | ICD-10-CM | POA: Diagnosis not present

## 2016-11-16 DIAGNOSIS — M25611 Stiffness of right shoulder, not elsewhere classified: Secondary | ICD-10-CM | POA: Diagnosis not present

## 2016-11-22 DIAGNOSIS — M25511 Pain in right shoulder: Secondary | ICD-10-CM | POA: Diagnosis not present

## 2016-11-22 DIAGNOSIS — M25611 Stiffness of right shoulder, not elsewhere classified: Secondary | ICD-10-CM | POA: Diagnosis not present

## 2016-11-24 DIAGNOSIS — M25511 Pain in right shoulder: Secondary | ICD-10-CM | POA: Diagnosis not present

## 2016-11-24 DIAGNOSIS — M25611 Stiffness of right shoulder, not elsewhere classified: Secondary | ICD-10-CM | POA: Diagnosis not present

## 2016-11-28 DIAGNOSIS — M25511 Pain in right shoulder: Secondary | ICD-10-CM | POA: Diagnosis not present

## 2016-11-28 DIAGNOSIS — M25611 Stiffness of right shoulder, not elsewhere classified: Secondary | ICD-10-CM | POA: Diagnosis not present

## 2016-11-30 DIAGNOSIS — Z9889 Other specified postprocedural states: Secondary | ICD-10-CM | POA: Diagnosis not present

## 2016-12-01 DIAGNOSIS — M25611 Stiffness of right shoulder, not elsewhere classified: Secondary | ICD-10-CM | POA: Diagnosis not present

## 2016-12-01 DIAGNOSIS — M25511 Pain in right shoulder: Secondary | ICD-10-CM | POA: Diagnosis not present

## 2016-12-05 DIAGNOSIS — M25611 Stiffness of right shoulder, not elsewhere classified: Secondary | ICD-10-CM | POA: Diagnosis not present

## 2016-12-05 DIAGNOSIS — M25511 Pain in right shoulder: Secondary | ICD-10-CM | POA: Diagnosis not present

## 2016-12-07 DIAGNOSIS — M25611 Stiffness of right shoulder, not elsewhere classified: Secondary | ICD-10-CM | POA: Diagnosis not present

## 2016-12-07 DIAGNOSIS — M25511 Pain in right shoulder: Secondary | ICD-10-CM | POA: Diagnosis not present

## 2016-12-12 DIAGNOSIS — M25611 Stiffness of right shoulder, not elsewhere classified: Secondary | ICD-10-CM | POA: Diagnosis not present

## 2016-12-12 DIAGNOSIS — M25511 Pain in right shoulder: Secondary | ICD-10-CM | POA: Diagnosis not present

## 2016-12-15 DIAGNOSIS — M25611 Stiffness of right shoulder, not elsewhere classified: Secondary | ICD-10-CM | POA: Diagnosis not present

## 2016-12-15 DIAGNOSIS — M25511 Pain in right shoulder: Secondary | ICD-10-CM | POA: Diagnosis not present

## 2016-12-19 DIAGNOSIS — M25511 Pain in right shoulder: Secondary | ICD-10-CM | POA: Diagnosis not present

## 2016-12-19 DIAGNOSIS — M25611 Stiffness of right shoulder, not elsewhere classified: Secondary | ICD-10-CM | POA: Diagnosis not present

## 2016-12-23 DIAGNOSIS — M25611 Stiffness of right shoulder, not elsewhere classified: Secondary | ICD-10-CM | POA: Diagnosis not present

## 2016-12-23 DIAGNOSIS — M25511 Pain in right shoulder: Secondary | ICD-10-CM | POA: Diagnosis not present

## 2016-12-26 DIAGNOSIS — M25511 Pain in right shoulder: Secondary | ICD-10-CM | POA: Diagnosis not present

## 2016-12-26 DIAGNOSIS — M25611 Stiffness of right shoulder, not elsewhere classified: Secondary | ICD-10-CM | POA: Diagnosis not present

## 2016-12-28 DIAGNOSIS — M25511 Pain in right shoulder: Secondary | ICD-10-CM | POA: Diagnosis not present

## 2016-12-28 DIAGNOSIS — M25611 Stiffness of right shoulder, not elsewhere classified: Secondary | ICD-10-CM | POA: Diagnosis not present

## 2017-01-02 DIAGNOSIS — M25511 Pain in right shoulder: Secondary | ICD-10-CM | POA: Diagnosis not present

## 2017-01-02 DIAGNOSIS — M25611 Stiffness of right shoulder, not elsewhere classified: Secondary | ICD-10-CM | POA: Diagnosis not present

## 2017-01-04 DIAGNOSIS — M25511 Pain in right shoulder: Secondary | ICD-10-CM | POA: Diagnosis not present

## 2017-01-04 DIAGNOSIS — M25611 Stiffness of right shoulder, not elsewhere classified: Secondary | ICD-10-CM | POA: Diagnosis not present

## 2017-01-09 DIAGNOSIS — M25511 Pain in right shoulder: Secondary | ICD-10-CM | POA: Diagnosis not present

## 2017-01-09 DIAGNOSIS — M25611 Stiffness of right shoulder, not elsewhere classified: Secondary | ICD-10-CM | POA: Diagnosis not present

## 2017-01-11 DIAGNOSIS — Z9889 Other specified postprocedural states: Secondary | ICD-10-CM | POA: Diagnosis not present

## 2017-01-11 DIAGNOSIS — M25611 Stiffness of right shoulder, not elsewhere classified: Secondary | ICD-10-CM | POA: Diagnosis not present

## 2017-01-11 DIAGNOSIS — M25511 Pain in right shoulder: Secondary | ICD-10-CM | POA: Diagnosis not present

## 2017-04-14 DIAGNOSIS — M25511 Pain in right shoulder: Secondary | ICD-10-CM | POA: Diagnosis not present

## 2017-05-02 DIAGNOSIS — E559 Vitamin D deficiency, unspecified: Secondary | ICD-10-CM | POA: Diagnosis not present

## 2017-05-02 DIAGNOSIS — J309 Allergic rhinitis, unspecified: Secondary | ICD-10-CM | POA: Diagnosis not present

## 2017-05-02 DIAGNOSIS — M7581 Other shoulder lesions, right shoulder: Secondary | ICD-10-CM | POA: Diagnosis not present

## 2017-05-02 DIAGNOSIS — I10 Essential (primary) hypertension: Secondary | ICD-10-CM | POA: Diagnosis not present

## 2017-05-02 DIAGNOSIS — E782 Mixed hyperlipidemia: Secondary | ICD-10-CM | POA: Diagnosis not present

## 2017-05-02 DIAGNOSIS — D510 Vitamin B12 deficiency anemia due to intrinsic factor deficiency: Secondary | ICD-10-CM | POA: Diagnosis not present

## 2017-05-02 DIAGNOSIS — Z1389 Encounter for screening for other disorder: Secondary | ICD-10-CM | POA: Diagnosis not present

## 2017-05-02 DIAGNOSIS — E039 Hypothyroidism, unspecified: Secondary | ICD-10-CM | POA: Diagnosis not present

## 2017-05-02 DIAGNOSIS — G250 Essential tremor: Secondary | ICD-10-CM | POA: Diagnosis not present

## 2017-11-07 DIAGNOSIS — E559 Vitamin D deficiency, unspecified: Secondary | ICD-10-CM | POA: Diagnosis not present

## 2017-11-07 DIAGNOSIS — G250 Essential tremor: Secondary | ICD-10-CM | POA: Diagnosis not present

## 2017-11-07 DIAGNOSIS — K4020 Bilateral inguinal hernia, without obstruction or gangrene, not specified as recurrent: Secondary | ICD-10-CM | POA: Diagnosis not present

## 2017-11-07 DIAGNOSIS — D510 Vitamin B12 deficiency anemia due to intrinsic factor deficiency: Secondary | ICD-10-CM | POA: Diagnosis not present

## 2017-11-07 DIAGNOSIS — E782 Mixed hyperlipidemia: Secondary | ICD-10-CM | POA: Diagnosis not present

## 2017-11-07 DIAGNOSIS — I10 Essential (primary) hypertension: Secondary | ICD-10-CM | POA: Diagnosis not present

## 2017-11-07 DIAGNOSIS — Z1159 Encounter for screening for other viral diseases: Secondary | ICD-10-CM | POA: Diagnosis not present

## 2017-11-07 DIAGNOSIS — E039 Hypothyroidism, unspecified: Secondary | ICD-10-CM | POA: Diagnosis not present

## 2017-11-07 DIAGNOSIS — M25511 Pain in right shoulder: Secondary | ICD-10-CM | POA: Diagnosis not present

## 2017-11-07 DIAGNOSIS — Z0000 Encounter for general adult medical examination without abnormal findings: Secondary | ICD-10-CM | POA: Diagnosis not present

## 2017-11-08 DIAGNOSIS — Z1211 Encounter for screening for malignant neoplasm of colon: Secondary | ICD-10-CM | POA: Diagnosis not present

## 2017-11-24 DIAGNOSIS — K4020 Bilateral inguinal hernia, without obstruction or gangrene, not specified as recurrent: Secondary | ICD-10-CM | POA: Diagnosis not present

## 2017-11-30 ENCOUNTER — Ambulatory Visit: Payer: Self-pay | Admitting: General Surgery

## 2017-12-28 ENCOUNTER — Encounter (HOSPITAL_BASED_OUTPATIENT_CLINIC_OR_DEPARTMENT_OTHER): Payer: Self-pay | Admitting: *Deleted

## 2018-01-04 ENCOUNTER — Other Ambulatory Visit: Payer: Self-pay

## 2018-01-04 ENCOUNTER — Encounter (HOSPITAL_BASED_OUTPATIENT_CLINIC_OR_DEPARTMENT_OTHER): Payer: Self-pay | Admitting: *Deleted

## 2018-01-04 NOTE — Progress Notes (Signed)
Your procedure is scheduled on  ____04-24-2019________  [facility]  6:30 A. M._____   Call this number if you have problems the morning of surgery  :[phone].   OUR ADDRESS IS [address].  WE ARE LOCATED IN [facility].                                     REMEMBER:  DO NOT EAT FOOD AFTER MIDNIGHT  WITH EXCEPTION CLEAR LIQUID DIET UNTIL 5:30 AM (SEE BELOW INSTRUCTIONS).   TAKE THESE MEDICATIONS MORNING OF SURGERY WITH A SIP OF WATER:  ______SYNTHROID, LIPITOR, B12                                    DO NOT WEAR JEWERLY.   DO NOT WEAR LOTIONS, POWDERS, PERFUMES OR DEODORANT.  MEN MAY SHAVE FACE AND NECK.  CONTACTS, GLASSES, OR DENTURES MAY NOT BE WORN TO SURGERY.                                    Christian Coleman IS NOT RESPONSIBLE  FOR ANY BELONGINGS.                                                                    .                                                                 NO SOLID FOOD AFTER MIDNIGHT THE NIGHT PRIOR TO SURGERY. NOTHING BY MOUTH EXCEPT CLEAR LIQUIDS UNTIL 3 HOURS PRIOR TO SCHEULED SURGERY. PLEASE FINISH ENSURE DRINK PER SURGEON ORDER 3 HOURS PRIOR TO SCHEDULED SURGERY TIME WHICH NEEDS TO BE COMPLETED AT ____5:30 AM________.   CLEAR LIQUID DIET   Foods Allowed                                                                     Foods Excluded  Coffee and tea, regular and decaf                             liquids that you cannot  Plain Jell-O in any flavor                                             see through such as: Fruit ices (not with fruit pulp)  milk, soups, orange juice  Iced Popsicles                                    All solid food Carbonated beverages, regular and diet                                    Cranberry, grape and apple juices Sports drinks like Gatorade Lightly seasoned clear broth or consume(fat free) Sugar, honey syrup  Sample  Menu Breakfast                                Lunch                                     Supper Cranberry juice                    Beef broth                            Chicken broth Jell-O                                     Grape juice                           Apple juice Coffee or tea                        Jell-O                                      Popsicle                                                Coffee or tea                        Coffee or tea  _____________________________________________________________________  [facility] - Preparing for Surgery  Before surgery, you can play an important role.  Because skin is not sterile, your skin needs to be as free of germs as possible.  You can reduce the number of germs on you skin by washing with CHG (chlorahexidine gluconate) soap before surgery.  CHG is an antiseptic cleaner which kills germs and bonds with the skin to continue killing germs even after washing.  Please DO NOT use if you have an allergy to CHG or antibacterial soaps.  If your skin becomes reddened/irritated stop using the CHG and inform your nurse when you arrive at Short Stay.  Do not shave (including legs and underarms) for at least 48 hours prior to the first CHG shower.  You may shave your face.  Please follow these instructions carefully:   1.  Shower with CHG Soap the night before surgery and the  morning of Surgery.  2.  If you choose to wash your hair, wash your hair first as usual with your       normal shampoo.  3.  After you shampoo, rinse your hair and body thoroughly to remove the                      Shampoo.  4.  Use CHG as you would any other liquid soap.  You can apply chg directly       to the skin and wash gently with scrungie or a clean washcloth.  5.  Apply the CHG Soap to your body ONLY FROM THE NECK DOWN.        Do not use on open wounds or open sores.  Avoid contact with your eyes,       ears, mouth and genitals  (private parts).  Wash genitals (private parts)       with your normal soap.  6.  Wash thoroughly, paying special attention to the area where your surgery        will be performed.  7.  Thoroughly rinse your body with warm water from the neck down.  8.  DO NOT shower/wash with your normal soap after using and rinsing off       the CHG Soap.  9.  Pat yourself dry with a clean towel.            10.  Wear clean pajamas.            11.  Place clean sheets on your bed the night of your first shower and do not        sleep with pets.  Day of Surgery  Do not apply any lotions/deoderants the morning of surgery.  Please wear clean clothes to the hospital/surgery center.

## 2018-01-04 NOTE — Progress Notes (Addendum)
SPOKE W/ PT VIA PHONE FOR PRE-OP INTERVIEW.  NPO AFTER MN WITH EXCEPTION CLEAR DIET UNTIL 0530, AT WHICH TIME TO FINISH ENSURE PRE-SURGERY DRINK.  ARRIVE AT 0630.  NEEDS ISTAT AND EKG.   WILL TAKE SYNTHROID, LIPITOR, AND B12 AM DOS W/ SIPS OF WATER.  WILL DO HIBICLENS SHOWER HS BEFORE AND AM DOS.  PT WILL PICK-UP ONE DRINK AND HIBICLENS Monday 01-08-2018 AM W/ COPY OF INSTRUCTIONS.

## 2018-01-09 NOTE — Anesthesia Preprocedure Evaluation (Signed)
Anesthesia Evaluation  Patient identified by MRN, date of birth, ID band Patient awake    Reviewed: Allergy & Precautions, NPO status , Patient's Chart, lab work & pertinent test results  Airway Mallampati: II  TM Distance: >3 FB Neck ROM: Full    Dental no notable dental hx.    Pulmonary neg pulmonary ROS,    Pulmonary exam normal breath sounds clear to auscultation       Cardiovascular hypertension, Normal cardiovascular exam Rhythm:Regular Rate:Normal     Neuro/Psych negative neurological ROS  negative psych ROS   GI/Hepatic negative GI ROS, Neg liver ROS,   Endo/Other  Hypothyroidism   Renal/GU negative Renal ROS  negative genitourinary   Musculoskeletal negative musculoskeletal ROS (+)   Abdominal   Peds negative pediatric ROS (+)  Hematology negative hematology ROS (+)   Anesthesia Other Findings   Reproductive/Obstetrics negative OB ROS                             Anesthesia Physical Anesthesia Plan  ASA: II  Anesthesia Plan: General   Post-op Pain Management:    Induction: Intravenous  PONV Risk Score and Plan: 2 and Ondansetron, Dexamethasone and Treatment may vary due to age or medical condition  Airway Management Planned: LMA  Additional Equipment:   Intra-op Plan:   Post-operative Plan: Extubation in OR  Informed Consent: I have reviewed the patients History and Physical, chart, labs and discussed the procedure including the risks, benefits and alternatives for the proposed anesthesia with the patient or authorized representative who has indicated his/her understanding and acceptance.   Dental advisory given  Plan Discussed with: CRNA and Surgeon  Anesthesia Plan Comments:         Anesthesia Quick Evaluation

## 2018-01-10 ENCOUNTER — Ambulatory Visit (HOSPITAL_BASED_OUTPATIENT_CLINIC_OR_DEPARTMENT_OTHER): Payer: Medicare HMO | Admitting: Anesthesiology

## 2018-01-10 ENCOUNTER — Encounter (HOSPITAL_BASED_OUTPATIENT_CLINIC_OR_DEPARTMENT_OTHER): Payer: Self-pay

## 2018-01-10 ENCOUNTER — Encounter (HOSPITAL_BASED_OUTPATIENT_CLINIC_OR_DEPARTMENT_OTHER)
Admission: RE | Disposition: A | Discharge status: Home / Self Care | Payer: Self-pay | Source: Ambulatory Visit | Attending: General Surgery

## 2018-01-10 ENCOUNTER — Ambulatory Visit (HOSPITAL_BASED_OUTPATIENT_CLINIC_OR_DEPARTMENT_OTHER)
Admission: RE | Admit: 2018-01-10 | Discharge: 2018-01-10 | Disposition: A | Discharge status: Home / Self Care | Payer: Medicare HMO | Source: Ambulatory Visit | Attending: General Surgery | Admitting: General Surgery

## 2018-01-10 DIAGNOSIS — K4020 Bilateral inguinal hernia, without obstruction or gangrene, not specified as recurrent: Secondary | ICD-10-CM | POA: Diagnosis not present

## 2018-01-10 DIAGNOSIS — E039 Hypothyroidism, unspecified: Secondary | ICD-10-CM | POA: Diagnosis not present

## 2018-01-10 DIAGNOSIS — Z79890 Hormone replacement therapy (postmenopausal): Secondary | ICD-10-CM | POA: Insufficient documentation

## 2018-01-10 DIAGNOSIS — Z79899 Other long term (current) drug therapy: Secondary | ICD-10-CM | POA: Insufficient documentation

## 2018-01-10 DIAGNOSIS — E538 Deficiency of other specified B group vitamins: Secondary | ICD-10-CM | POA: Insufficient documentation

## 2018-01-10 DIAGNOSIS — E785 Hyperlipidemia, unspecified: Secondary | ICD-10-CM | POA: Insufficient documentation

## 2018-01-10 DIAGNOSIS — E559 Vitamin D deficiency, unspecified: Secondary | ICD-10-CM | POA: Insufficient documentation

## 2018-01-10 DIAGNOSIS — Z7982 Long term (current) use of aspirin: Secondary | ICD-10-CM | POA: Diagnosis not present

## 2018-01-10 DIAGNOSIS — I10 Essential (primary) hypertension: Secondary | ICD-10-CM | POA: Insufficient documentation

## 2018-01-10 HISTORY — DX: Other specified disorders of nose and nasal sinuses: J34.89

## 2018-01-10 HISTORY — DX: Presence of spectacles and contact lenses: Z97.3

## 2018-01-10 HISTORY — DX: Presence of external hearing-aid: Z97.4

## 2018-01-10 HISTORY — DX: Essential tremor: G25.0

## 2018-01-10 HISTORY — DX: Bilateral inguinal hernia, without obstruction or gangrene, not specified as recurrent: K40.20

## 2018-01-10 HISTORY — PX: INSERTION OF MESH: SHX5868

## 2018-01-10 HISTORY — DX: Essential (primary) hypertension: I10

## 2018-01-10 HISTORY — DX: Pain in right shoulder: M25.511

## 2018-01-10 HISTORY — PX: INGUINAL HERNIA REPAIR: SHX194

## 2018-01-10 LAB — POCT I-STAT 4, (NA,K, GLUC, HGB,HCT)
Glucose, Bld: 107 mg/dL — ABNORMAL HIGH (ref 65–99)
HCT: 44 % (ref 39.0–52.0)
Hemoglobin: 15 g/dL (ref 13.0–17.0)
POTASSIUM: 3.8 mmol/L (ref 3.5–5.1)
SODIUM: 143 mmol/L (ref 135–145)

## 2018-01-10 SURGERY — REPAIR, HERNIA, INGUINAL, BILATERAL, LAPAROSCOPIC
Anesthesia: General | Site: Groin | Laterality: Bilateral

## 2018-01-10 MED ORDER — FENTANYL CITRATE (PF) 100 MCG/2ML IJ SOLN
INTRAMUSCULAR | Status: DC | PRN
  Administered 2018-01-10: 25 ug via INTRAVENOUS
  Administered 2018-01-10: 50 ug via INTRAVENOUS
  Administered 2018-01-10: 25 ug via INTRAVENOUS

## 2018-01-10 MED ORDER — KETOROLAC TROMETHAMINE 30 MG/ML IJ SOLN
30.0000 mg | Freq: Once | INTRAMUSCULAR | Status: DC | PRN
  Filled 2018-01-10: qty 1

## 2018-01-10 MED ORDER — CEFAZOLIN SODIUM-DEXTROSE 2-4 GM/100ML-% IV SOLN
2.0000 g | INTRAVENOUS | Status: AC
  Administered 2018-01-10: 2 g via INTRAVENOUS
  Filled 2018-01-10: qty 100

## 2018-01-10 MED ORDER — OXYCODONE HCL 5 MG/5ML PO SOLN
5.0000 mg | Freq: Once | ORAL | Status: DC | PRN
  Filled 2018-01-10: qty 5

## 2018-01-10 MED ORDER — LIDOCAINE 2% (20 MG/ML) 5 ML SYRINGE
INTRAMUSCULAR | Status: DC | PRN
  Administered 2018-01-10: 60 mg via INTRAVENOUS

## 2018-01-10 MED ORDER — ROCURONIUM BROMIDE 10 MG/ML (PF) SYRINGE
PREFILLED_SYRINGE | INTRAVENOUS | Status: AC
  Filled 2018-01-10: qty 5

## 2018-01-10 MED ORDER — MIDAZOLAM HCL 2 MG/2ML IJ SOLN
INTRAMUSCULAR | Status: DC | PRN
  Administered 2018-01-10: 2 mg via INTRAVENOUS

## 2018-01-10 MED ORDER — PROMETHAZINE HCL 25 MG/ML IJ SOLN
6.2500 mg | INTRAMUSCULAR | Status: DC | PRN
  Filled 2018-01-10: qty 1

## 2018-01-10 MED ORDER — ROCURONIUM BROMIDE 10 MG/ML (PF) SYRINGE
PREFILLED_SYRINGE | INTRAVENOUS | Status: DC | PRN
  Administered 2018-01-10: 60 mg via INTRAVENOUS

## 2018-01-10 MED ORDER — WHITE PETROLATUM EX OINT
TOPICAL_OINTMENT | CUTANEOUS | Status: AC
  Filled 2018-01-10: qty 5

## 2018-01-10 MED ORDER — IBUPROFEN 800 MG PO TABS: 800.0000 mg | ORAL_TABLET | Freq: Three times a day (TID) | ORAL | 0 refills | Status: DC | PRN

## 2018-01-10 MED ORDER — ACETAMINOPHEN 500 MG PO TABS
ORAL_TABLET | ORAL | Status: AC
  Filled 2018-01-10: qty 2

## 2018-01-10 MED ORDER — LIDOCAINE 2% (20 MG/ML) 5 ML SYRINGE
INTRAMUSCULAR | Status: DC | PRN
  Administered 2018-01-10: 1.5 mg/kg/h via INTRAVENOUS

## 2018-01-10 MED ORDER — ONDANSETRON HCL 4 MG/2ML IJ SOLN
INTRAMUSCULAR | Status: DC | PRN
  Administered 2018-01-10: 4 mg via INTRAVENOUS

## 2018-01-10 MED ORDER — PROPOFOL 10 MG/ML IV BOLUS
INTRAVENOUS | Status: AC
  Filled 2018-01-10: qty 20

## 2018-01-10 MED ORDER — CEFAZOLIN SODIUM-DEXTROSE 2-4 GM/100ML-% IV SOLN
INTRAVENOUS | Status: AC
  Filled 2018-01-10: qty 100

## 2018-01-10 MED ORDER — 0.9 % SODIUM CHLORIDE (POUR BTL) OPTIME
TOPICAL | Status: DC | PRN
  Administered 2018-01-10: 500 mL

## 2018-01-10 MED ORDER — DEXAMETHASONE SODIUM PHOSPHATE 10 MG/ML IJ SOLN
INTRAMUSCULAR | Status: DC | PRN
  Administered 2018-01-10: 10 mg via INTRAVENOUS

## 2018-01-10 MED ORDER — GABAPENTIN 300 MG PO CAPS
ORAL_CAPSULE | ORAL | Status: AC
  Filled 2018-01-10: qty 1

## 2018-01-10 MED ORDER — PROPOFOL 10 MG/ML IV BOLUS
INTRAVENOUS | Status: DC | PRN
  Administered 2018-01-10: 150 mg via INTRAVENOUS

## 2018-01-10 MED ORDER — LABETALOL HCL 5 MG/ML IV SOLN
INTRAVENOUS | Status: AC
  Filled 2018-01-10: qty 4

## 2018-01-10 MED ORDER — CHLORHEXIDINE GLUCONATE CLOTH 2 % EX PADS
6.0000 | MEDICATED_PAD | Freq: Once | CUTANEOUS | Status: DC
  Filled 2018-01-10: qty 6

## 2018-01-10 MED ORDER — OXYCODONE HCL 5 MG PO TABS
5.0000 mg | ORAL_TABLET | Freq: Once | ORAL | Status: DC | PRN
  Filled 2018-01-10: qty 1

## 2018-01-10 MED ORDER — GABAPENTIN 300 MG PO CAPS
300.0000 mg | ORAL_CAPSULE | ORAL | Status: AC
  Administered 2018-01-10: 300 mg via ORAL
  Filled 2018-01-10: qty 1

## 2018-01-10 MED ORDER — PHENYLEPHRINE 40 MCG/ML (10ML) SYRINGE FOR IV PUSH (FOR BLOOD PRESSURE SUPPORT)
PREFILLED_SYRINGE | INTRAVENOUS | Status: AC
  Filled 2018-01-10: qty 10

## 2018-01-10 MED ORDER — MIDAZOLAM HCL 2 MG/2ML IJ SOLN
INTRAMUSCULAR | Status: AC
  Filled 2018-01-10: qty 2

## 2018-01-10 MED ORDER — ACETAMINOPHEN 500 MG PO TABS
1000.0000 mg | ORAL_TABLET | ORAL | Status: AC
  Administered 2018-01-10: 1000 mg via ORAL
  Filled 2018-01-10: qty 2

## 2018-01-10 MED ORDER — SUGAMMADEX SODIUM 200 MG/2ML IV SOLN
INTRAVENOUS | Status: DC | PRN
  Administered 2018-01-10: 150 mg via INTRAVENOUS

## 2018-01-10 MED ORDER — DEXAMETHASONE SODIUM PHOSPHATE 10 MG/ML IJ SOLN
INTRAMUSCULAR | Status: AC
  Filled 2018-01-10: qty 1

## 2018-01-10 MED ORDER — LIDOCAINE 2% (20 MG/ML) 5 ML SYRINGE
INTRAMUSCULAR | Status: AC
  Filled 2018-01-10: qty 5

## 2018-01-10 MED ORDER — LACTATED RINGERS IV SOLN
INTRAVENOUS | Status: DC
  Administered 2018-01-10 (×2): via INTRAVENOUS
  Filled 2018-01-10: qty 1000

## 2018-01-10 MED ORDER — ENSURE PRE-SURGERY PO LIQD
296.0000 mL | Freq: Once | ORAL | Status: DC
  Filled 2018-01-10: qty 296

## 2018-01-10 MED ORDER — LABETALOL HCL 5 MG/ML IV SOLN
INTRAVENOUS | Status: DC | PRN
  Administered 2018-01-10 (×2): 5 mg via INTRAVENOUS

## 2018-01-10 MED ORDER — SUGAMMADEX SODIUM 200 MG/2ML IV SOLN
INTRAVENOUS | Status: AC
  Filled 2018-01-10: qty 2

## 2018-01-10 MED ORDER — PHENYLEPHRINE 40 MCG/ML (10ML) SYRINGE FOR IV PUSH (FOR BLOOD PRESSURE SUPPORT)
PREFILLED_SYRINGE | INTRAVENOUS | Status: DC | PRN
  Administered 2018-01-10 (×2): 50 ug via INTRAVENOUS

## 2018-01-10 MED ORDER — HYDROCODONE-ACETAMINOPHEN 5-325 MG PO TABS: 1.0000 | ORAL_TABLET | Freq: Four times a day (QID) | ORAL | 0 refills | Status: DC | PRN

## 2018-01-10 MED ORDER — BUPIVACAINE LIPOSOME 1.3 % IJ SUSP
INTRAMUSCULAR | Status: DC | PRN
  Administered 2018-01-10: 50 mL

## 2018-01-10 MED ORDER — ONDANSETRON HCL 4 MG/2ML IJ SOLN
INTRAMUSCULAR | Status: AC
  Filled 2018-01-10: qty 2

## 2018-01-10 MED ORDER — FENTANYL CITRATE (PF) 100 MCG/2ML IJ SOLN
25.0000 ug | INTRAMUSCULAR | Status: DC | PRN
  Filled 2018-01-10: qty 1

## 2018-01-10 MED ORDER — FENTANYL CITRATE (PF) 100 MCG/2ML IJ SOLN
INTRAMUSCULAR | Status: AC
  Filled 2018-01-10: qty 2

## 2018-01-10 SURGICAL SUPPLY — 34 items
BLADE CLIPPER SENSICLIP SURGIC (BLADE) ×2 IMPLANT
CABLE HIGH FREQUENCY MONO STRZ (ELECTRODE) ×2 IMPLANT
CHLORAPREP W/TINT 26ML (MISCELLANEOUS) ×2 IMPLANT
DECANTER SPIKE VIAL GLASS SM (MISCELLANEOUS) ×2 IMPLANT
DERMABOND ADVANCED (GAUZE/BANDAGES/DRESSINGS) ×1
DERMABOND ADVANCED .7 DNX12 (GAUZE/BANDAGES/DRESSINGS) ×1 IMPLANT
DEVICE PMI PUNCTURE CLOSURE (MISCELLANEOUS) IMPLANT
DEVICE SECURE STRAP 25 ABSORB (INSTRUMENTS) ×2 IMPLANT
DRAPE LAPAROSCOPIC ABDOMINAL (DRAPES) ×2 IMPLANT
DRSG COVADERM PLUS 2X2 (GAUZE/BANDAGES/DRESSINGS) IMPLANT
ELECT REM PT RETURN 9FT ADLT (ELECTROSURGICAL) ×2
ELECTRODE REM PT RTRN 9FT ADLT (ELECTROSURGICAL) ×1 IMPLANT
GLOVE BIOGEL PI IND STRL 7.0 (GLOVE) ×1 IMPLANT
GLOVE BIOGEL PI INDICATOR 7.0 (GLOVE) ×1
GLOVE SURG SS PI 7.0 STRL IVOR (GLOVE) ×2 IMPLANT
GOWN STRL REUS W/TWL LRG LVL3 (GOWN DISPOSABLE) ×2 IMPLANT
IRRIG SUCT STRYKERFLOW 2 WTIP (MISCELLANEOUS) ×2
IRRIGATION SUCT STRKRFLW 2 WTP (MISCELLANEOUS) ×1 IMPLANT
MESH 3DMAX LIGHT 4.8X6.7 LT XL (Mesh General) ×2 IMPLANT
MESH 3DMAX LIGHT 4.8X6.7 RT XL (Mesh General) ×2 IMPLANT
PACK BASIN DAY SURGERY FS (CUSTOM PROCEDURE TRAY) ×2 IMPLANT
SCISSORS LAP 5X35 DISP (ENDOMECHANICALS) IMPLANT
SHEARS HARMONIC ACE PLUS 36CM (ENDOMECHANICALS) IMPLANT
SUT MNCRL AB 4-0 PS2 18 (SUTURE) ×2 IMPLANT
SUT VIC AB 2-0 SH 27 (SUTURE)
SUT VIC AB 2-0 SH 27X BRD (SUTURE) IMPLANT
SUT VICRYL 0 UR6 27IN ABS (SUTURE) IMPLANT
SUT VLOC BARB 180 ABS3/0GR12 (SUTURE)
SUTURE VLOC BRB 180 ABS3/0GR12 (SUTURE) IMPLANT
TOWEL OR 17X24 6PK STRL BLUE (TOWEL DISPOSABLE) ×2 IMPLANT
TRAY LAPAROSCOPIC (CUSTOM PROCEDURE TRAY) ×2 IMPLANT
TROCAR BLADELESS OPT 12M 100M (ENDOMECHANICALS) ×2 IMPLANT
TROCAR BLADELESS OPT 5 100 (ENDOMECHANICALS) ×4 IMPLANT
TUBING INSUF HEATED (TUBING) ×2 IMPLANT

## 2018-01-10 NOTE — Anesthesia Postprocedure Evaluation (Signed)
Anesthesia Post Note  Patient: Christian Coleman  Procedure(s) Performed: LAPAROSCOPIC BILATERAL INGUINAL HERNIA (Bilateral Groin) INSERTION OF MESH (Bilateral Groin)     Patient location during evaluation: PACU Anesthesia Type: General Level of consciousness: awake and alert Pain management: pain level controlled Vital Signs Assessment: post-procedure vital signs reviewed and stable Respiratory status: spontaneous breathing, nonlabored ventilation, respiratory function stable and patient connected to nasal cannula oxygen Cardiovascular status: blood pressure returned to baseline and stable Postop Assessment: no apparent nausea or vomiting Anesthetic complications: no    Last Vitals:  Vitals:   01/10/18 0945 01/10/18 1000  BP: (!) 171/111 (!) 148/101  Pulse: 80 69  Resp: 11 12  Temp: 36.4 C   SpO2: 99% 98%    Last Pain:  Vitals:   01/10/18 1015  TempSrc:   PainSc: 0-No pain                 Robbert Langlinais S     

## 2018-01-10 NOTE — Anesthesia Postprocedure Evaluation (Signed)
Anesthesia Post Note  Patient: Christian PumaMichael T Coleman  Procedure(s) Performed: LAPAROSCOPIC BILATERAL INGUINAL HERNIA (Bilateral Groin) INSERTION OF MESH (Bilateral Groin)     Patient location during evaluation: PACU Anesthesia Type: General Level of consciousness: awake and alert Pain management: pain level controlled Vital Signs Assessment: post-procedure vital signs reviewed and stable Respiratory status: spontaneous breathing, nonlabored ventilation, respiratory function stable and patient connected to nasal cannula oxygen Cardiovascular status: blood pressure returned to baseline and stable Postop Assessment: no apparent nausea or vomiting Anesthetic complications: no    Last Vitals:  Vitals:   01/10/18 0945 01/10/18 1000  BP: (!) 171/111 (!) 148/101  Pulse: 80 69  Resp: 11 12  Temp: 36.4 C   SpO2: 99% 98%    Last Pain:  Vitals:   01/10/18 1015  TempSrc:   PainSc: 0-No pain                 Kadra Kohan S

## 2018-01-10 NOTE — Anesthesia Procedure Notes (Signed)
Procedure Name: Intubation Date/Time: 01/10/2018 8:39 AM Performed by: Suan Halter, CRNA Pre-anesthesia Checklist: Patient identified, Emergency Drugs available, Suction available and Patient being monitored Patient Re-evaluated:Patient Re-evaluated prior to induction Oxygen Delivery Method: Circle system utilized Preoxygenation: Pre-oxygenation with 100% oxygen Induction Type: IV induction Ventilation: Mask ventilation without difficulty Laryngoscope Size: Mac and 3 Grade View: Grade I Tube type: Oral Tube size: 8.0 mm Number of attempts: 1 Airway Equipment and Method: Stylet and Oral airway Placement Confirmation: ETT inserted through vocal cords under direct vision,  positive ETCO2 and breath sounds checked- equal and bilateral Secured at: 23 cm Tube secured with: Tape Dental Injury: Teeth and Oropharynx as per pre-operative assessment

## 2018-01-10 NOTE — H&P (Signed)
Christian Coleman is an 67 y.o. male.   Chief Complaint: hernias HPI: 67 yo male with inguinal pain and findings of bilateral inguinal hernia on exam. He denies nausea or vomiting. The hernias have enlarged.  Past Medical History:  Diagnosis Date  . Arthritis   . Benign essential tremor   . Dyslipidemia   . Hypertension   . Hypothyroidism   . Inguinal hernia, bilateral   . Right shoulder pain   . Sinus drainage   . Vitamin B12 deficiency   . Vitamin D deficiency   . Wears glasses    just for driving  . Wears hearing aid in both ears     Past Surgical History:  Procedure Laterality Date  . CERVICAL SPINE SURGERY  1990s  . LUMBAR SPINE SURGERY  1980s  . NASAL SINUS SURGERY  2005  approx.  Marland Kitchen. SHOULDER OPEN ROTATOR CUFF REPAIR Right 10/18/2016    Family History  Problem Relation Age of Onset  . Tremor Mother   . Heart disease Mother   . Cancer - Other Father        Throat  . Tremor Maternal Grandfather    Social History:  reports that he has never smoked. He has never used smokeless tobacco. He reports that he does not drink alcohol or use drugs.  Allergies:  Allergies  Allergen Reactions  . Propranolol Other (See Comments)    Dizzy    Medications Prior to Admission  Medication Sig Dispense Refill  . aspirin EC 81 MG tablet Take 81 mg by mouth daily.    Marland Kitchen. atorvastatin (LIPITOR) 20 MG tablet Take 20 mg by mouth every morning.     . Cholecalciferol (VITAMIN D3) 2000 units TABS Take 1 capsule by mouth daily.     . Coenzyme Q10 (COQ-10) 100 MG CAPS Take 1 capsule by mouth daily.     . Cyanocobalamin (B-12) 500 MCG SUBL Place under the tongue daily.     Marland Kitchen. levothyroxine (SYNTHROID, LEVOTHROID) 175 MCG tablet Take 175 mcg by mouth daily before breakfast.     . losartan (COZAAR) 100 MG tablet Take 100 mg by mouth every morning.    . Magnesium 250 MG TABS Take 1 tablet by mouth daily.     . Multiple Vitamin (MULTIVITAMIN) tablet Take 1 tablet by mouth daily.      Results  for orders placed or performed during the hospital encounter of 01/10/18 (from the past 48 hour(s))  I-STAT 4, (NA,K, GLUC, HGB,HCT)     Status: Abnormal   Collection Time: 01/10/18  7:30 AM  Result Value Ref Range   Sodium 143 135 - 145 mmol/L   Potassium 3.8 3.5 - 5.1 mmol/L   Glucose, Bld 107 (H) 65 - 99 mg/dL   HCT 16.144.0 09.639.0 - 04.552.0 %   Hemoglobin 15.0 13.0 - 17.0 g/dL   No results found.  Review of Systems  Constitutional: Negative for chills and fever.  HENT: Negative for hearing loss.   Eyes: Negative for blurred vision and double vision.  Respiratory: Negative for cough and hemoptysis.   Cardiovascular: Negative for chest pain and palpitations.  Gastrointestinal: Negative for abdominal pain, nausea and vomiting.  Genitourinary: Negative for dysuria and urgency.  Musculoskeletal: Negative for myalgias and neck pain.  Skin: Negative for itching and rash.  Neurological: Negative for dizziness, tingling and headaches.  Endo/Heme/Allergies: Does not bruise/bleed easily.  Psychiatric/Behavioral: Negative for depression and suicidal ideas.    Blood pressure (!) 162/87, pulse 86, temperature 98.9  F (37.2 C), temperature source Oral, resp. rate 16, height 5' 11.5" (1.816 m), weight 77 kg (169 lb 11.2 oz), SpO2 99 %. Physical Exam  Vitals reviewed. Constitutional: He is oriented to person, place, and time. He appears well-developed and well-nourished.  HENT:  Head: Normocephalic and atraumatic.  Eyes: Pupils are equal, round, and reactive to light. Conjunctivae and EOM are normal.  Neck: Normal range of motion. Neck supple.  Cardiovascular: Normal rate and regular rhythm.  Respiratory: Effort normal and breath sounds normal.  GI: Soft. Bowel sounds are normal. He exhibits no distension. There is no tenderness.  Bilateral inguinal hernia  Musculoskeletal: Normal range of motion.  Neurological: He is alert and oriented to person, place, and time.  Skin: Skin is warm and dry.   Psychiatric: He has a normal mood and affect. His behavior is normal.     Assessment/Plan 67 yo male with symptomatic bilateral reducible inguinal hernias -lap bilateral inguinal hernia repair with mesh -ER protocol -planned outpatient procedure  Rodman Pickle, MD 01/10/2018, 8:28 AM

## 2018-01-10 NOTE — Discharge Instructions (Signed)
°  Post Anesthesia Home Care Instructions ° °Activity: °Get plenty of rest for the remainder of the day. A responsible individual must stay with you for 24 hours following the procedure.  °For the next 24 hours, DO NOT: °-Drive a car °-Operate machinery °-Drink alcoholic beverages °-Take any medication unless instructed by your physician °-Make any legal decisions or sign important papers. ° °Meals: °Start with liquid foods such as gelatin or soup. Progress to regular foods as tolerated. Avoid greasy, spicy, heavy foods. If nausea and/or vomiting occur, drink only clear liquids until the nausea and/or vomiting subsides. Call your physician if vomiting continues. ° °Special Instructions/Symptoms: °Your throat may feel dry or sore from the anesthesia or the breathing tube placed in your throat during surgery. If this causes discomfort, gargle with warm salt water. The discomfort should disappear within 24 hours. ° °If you had a scopolamine patch placed behind your ear for the management of post- operative nausea and/or vomiting: ° °1. The medication in the patch is effective for 72 hours, after which it should be removed.  Wrap patch in a tissue and discard in the trash. Wash hands thoroughly with soap and water. °2. You may remove the patch earlier than 72 hours if you experience unpleasant side effects which may include dry mouth, dizziness or visual disturbances. °3. Avoid touching the patch. Wash your hands with soap and water after contact with the patch. °  ° ° °Information for Discharge Teaching: °EXPAREL (bupivacaine liposome injectable suspension)  ° °Your surgeon gave you EXPAREL(bupivacaine) in your surgical incision to help control your pain after surgery.  °· EXPAREL is a local anesthetic that provides pain relief by numbing the tissue around the surgical site. °· EXPAREL is designed to release pain medication over time and can control pain for up to 72 hours. °· Depending on how you respond to EXPAREL,  you may require less pain medication during your recovery. ° °Possible side effects: °· Temporary loss of sensation or ability to move in the area where bupivacaine was injected. °· Nausea, vomiting, constipation °· Rarely, numbness and tingling in your mouth or lips, lightheadedness, or anxiety may occur. °· Call your doctor right away if you think you may be experiencing any of these sensations, or if you have other questions regarding possible side effects. ° °Follow all other discharge instructions given to you by your surgeon or nurse. Eat a healthy diet and drink plenty of water or other fluids. ° °If you return to the hospital for any reason within 96 hours following the administration of EXPAREL, please inform your health care providers. °

## 2018-01-10 NOTE — Op Note (Signed)
Preop diagnosis: bilateral inguinal hernia  Postop diagnosis: bilateral direct inguinal hernia  Procedure: laparoscopic Bilateral inguinal hernia repair with mesh  Surgeon: Feliciana RossettiLuke Carver Murakami, M.D.  Asst: none  Anesthesia: Gen.   Indications for procedure: Christian PumaMichael T Coleman is a 67 y.o. male with symptoms of pain and enlarging Bilateral inguinal hernia(s). After discussing risks, alternatives and benefits he decided on laparoscopic repair and was brought to day surgery for repair.  Description of procedure: The patient was brought into the operative suite, placed supine. Anesthesia was administered with endotracheal tube. Patient was strapped in place. The patient was prepped and draped in the usual sterile fashion.  Next Exparel:Marcaine mix was injected to the Left of the umbilicus, and a transverse 2 cm incision was made. Dissection was used to visualize the anterior rectus sheath. Anterior rectus sheath was sharply incised, next to the 12 mm trocar was inserted into the preperitoneal space. Laparoscope was inserted to confirm placement and then used to bluntly dissect the retrorectus space clear. Once the midline ws free 2 5mm trocars were placed. 1 in the suprapubic area and 1in 5cm superior to the first.  On initial visualization , There was a moderate sized right direct inguinal hernia and the peritoneum was close to the internal inguinal ring as well. Next a began our dissection identifying the ASIS laterally and then working back medially removing the filmy tissue and adhesions of the peritoneum to the abdominal wall. Hernia sac was completely dissected out of the canal. Vas deference and contents of the cord were safely dissected away of the hernia sac. The Exparel mix was infused along the oblique rectus muscles laterally and near the lacunar ligament medially.  Next, blunt dissection was used to develop the left space. There was a torn vein which was cauterized for hemostasis. There was  a moderate sized left direct inguinal hernia and the peritoneum was close to the internal inguinal ring as well. Next a began our dissection identifying the ASIS laterally and then working back medially removing the filmy tissue and adhesions of the peritoneum to the abdominal wall. Hernia sac was completely dissected out of the canal. Vas deference and contents of the cord were safely dissected away of the hernia sac. The Exparel mix was infused along the oblique rectus muscles laterally and near the lacunar ligament medially.  A extra large left 3D max light weight mesh was inserted and tacked medially to the lacunar ligament. 2 additional tacks were used on the left at the lateral aspect and the superior aspect. The mesh was positioned flat and directly up against the direct and indirect areas. A extra large right 3D max light weight mesh was inserted and tacked medially to the lacunar ligament. 2 additional tacks were used on the right at the lateral aspect and the superior aspect. The mesh was positioned flat and directly up against the direct and indirect areas.  The CO2 was evacuated while watching to ensure the mesh did not migrate.. The anterior rectus fascia was closed with 0 vicryl in interrupted sutures and all skin incisions were closed with 4-0 monocryl subcu stitch. The patient awoke from anesthesia and was brought to PACU in stable condition.  Findings: bilateral direct inguinal hernia  Specimen: none  Blood loss: <30 ml  Local anesthesia: 30 ml Exparel:Marcaine mix  Complications: none  Implant: extra large right and extra large left Bard 3D max light mesh  Feliciana RossettiLuke Rusty Glodowski, M.D. General, Bariatric, & Minimally Invasive Surgery [facility] 9:32 AM  01/10/2018   

## 2018-01-10 NOTE — Transfer of Care (Signed)
Immediate Anesthesia Transfer of Care Note  Patient: Christian Coleman  Procedure(s) Performed: Procedure(s) (LRB): LAPAROSCOPIC BILATERAL INGUINAL HERNIA (Bilateral) INSERTION OF MESH (Bilateral)  Patient Location: PACU  Anesthesia Type: General  Level of Consciousness: awake, oriented, sedated and patient cooperative  Airway & Oxygen Therapy: Patient Spontanous Breathing and Patient connected to face mask oxygen  Post-op Assessment: Report given to PACU RN and Post -op Vital signs reviewed and stable  Post vital signs: Reviewed and stable  Complications: No apparent anesthesia complications Last Vitals:  Vitals Value Taken Time  BP 170/97 01/10/2018  9:46 AM  Temp 36.4 C 01/10/2018  9:45 AM  Pulse 70 01/10/2018  9:50 AM  Resp 13 01/10/2018  9:50 AM  SpO2 99 % 01/10/2018  9:50 AM  Vitals shown include unvalidated device data.  Last Pain:  Vitals:   01/10/18 0714  TempSrc:   PainSc: 5

## 2018-01-11 ENCOUNTER — Encounter (HOSPITAL_BASED_OUTPATIENT_CLINIC_OR_DEPARTMENT_OTHER): Payer: Self-pay | Admitting: General Surgery

## 2018-05-10 DIAGNOSIS — E782 Mixed hyperlipidemia: Secondary | ICD-10-CM | POA: Diagnosis not present

## 2018-05-10 DIAGNOSIS — E039 Hypothyroidism, unspecified: Secondary | ICD-10-CM | POA: Diagnosis not present

## 2018-05-10 DIAGNOSIS — E559 Vitamin D deficiency, unspecified: Secondary | ICD-10-CM | POA: Diagnosis not present

## 2018-05-10 DIAGNOSIS — L821 Other seborrheic keratosis: Secondary | ICD-10-CM | POA: Diagnosis not present

## 2018-05-10 DIAGNOSIS — M542 Cervicalgia: Secondary | ICD-10-CM | POA: Diagnosis not present

## 2018-05-10 DIAGNOSIS — J309 Allergic rhinitis, unspecified: Secondary | ICD-10-CM | POA: Diagnosis not present

## 2018-05-10 DIAGNOSIS — I10 Essential (primary) hypertension: Secondary | ICD-10-CM | POA: Diagnosis not present

## 2018-05-10 DIAGNOSIS — M25511 Pain in right shoulder: Secondary | ICD-10-CM | POA: Diagnosis not present

## 2018-05-10 DIAGNOSIS — D510 Vitamin B12 deficiency anemia due to intrinsic factor deficiency: Secondary | ICD-10-CM | POA: Diagnosis not present

## 2018-05-10 DIAGNOSIS — G250 Essential tremor: Secondary | ICD-10-CM | POA: Diagnosis not present

## 2018-07-03 DIAGNOSIS — R69 Illness, unspecified: Secondary | ICD-10-CM | POA: Diagnosis not present

## 2018-07-17 DIAGNOSIS — R69 Illness, unspecified: Secondary | ICD-10-CM | POA: Diagnosis not present

## 2018-11-15 DIAGNOSIS — Z125 Encounter for screening for malignant neoplasm of prostate: Secondary | ICD-10-CM | POA: Diagnosis not present

## 2018-11-15 DIAGNOSIS — E559 Vitamin D deficiency, unspecified: Secondary | ICD-10-CM | POA: Diagnosis not present

## 2018-11-15 DIAGNOSIS — G250 Essential tremor: Secondary | ICD-10-CM | POA: Diagnosis not present

## 2018-11-15 DIAGNOSIS — E782 Mixed hyperlipidemia: Secondary | ICD-10-CM | POA: Diagnosis not present

## 2018-11-15 DIAGNOSIS — J309 Allergic rhinitis, unspecified: Secondary | ICD-10-CM | POA: Diagnosis not present

## 2018-11-15 DIAGNOSIS — D510 Vitamin B12 deficiency anemia due to intrinsic factor deficiency: Secondary | ICD-10-CM | POA: Diagnosis not present

## 2018-11-15 DIAGNOSIS — E039 Hypothyroidism, unspecified: Secondary | ICD-10-CM | POA: Diagnosis not present

## 2018-11-15 DIAGNOSIS — Z0000 Encounter for general adult medical examination without abnormal findings: Secondary | ICD-10-CM | POA: Diagnosis not present

## 2018-11-15 DIAGNOSIS — Z1211 Encounter for screening for malignant neoplasm of colon: Secondary | ICD-10-CM | POA: Diagnosis not present

## 2018-11-15 DIAGNOSIS — I10 Essential (primary) hypertension: Secondary | ICD-10-CM | POA: Diagnosis not present

## 2018-11-19 DIAGNOSIS — E875 Hyperkalemia: Secondary | ICD-10-CM | POA: Diagnosis not present

## 2019-01-10 ENCOUNTER — Telehealth: Payer: Self-pay

## 2019-01-10 NOTE — Telephone Encounter (Signed)
I called pt to update his chart. No answer, left a message asking him to call me back. 

## 2019-01-10 NOTE — Telephone Encounter (Signed)
Due to current COVID 19 pandemic, our office is severely reducing in office visits for at least the next 2 weeks, in order to minimize the risk to our patients and healthcare providers.   I called pt to discuss converting his appt to a virtual visit. No answer, left a message asking him to call me back. If pt calls back, please discuss this with him and obtain consent for a VV.

## 2019-01-10 NOTE — Telephone Encounter (Signed)
01-10-2019 Pt has called and gave verbal consent to file insurance for a VV. Pt email address:[email or web address]  pt asked to download [employer] webex app/program https://[email or web address].[email or web address].html phone rep discussed the consent for video and phone visits, copay, the limitations to the physical exam, etc.

## 2019-01-10 NOTE — Telephone Encounter (Signed)
Email sent to [email or web address]

## 2019-01-16 ENCOUNTER — Encounter: Payer: Self-pay | Admitting: Neurology

## 2019-01-16 ENCOUNTER — Other Ambulatory Visit: Payer: Self-pay

## 2019-01-16 ENCOUNTER — Ambulatory Visit (INDEPENDENT_AMBULATORY_CARE_PROVIDER_SITE_OTHER): Payer: Medicare HMO | Admitting: Neurology

## 2019-01-16 DIAGNOSIS — G250 Essential tremor: Secondary | ICD-10-CM

## 2019-01-16 NOTE — Progress Notes (Signed)
Virtual Visit via Telephone Note  I connected with Christian Coleman on 01/16/19 at  8:30 AM EDT by telephone and verified that I am speaking with the correct person using two identifiers.   I discussed the limitations, risks, security and privacy concerns of performing an evaluation and management service by telephone and the availability of in person appointments. I also discussed with the patient that there may be a patient responsible charge related to this service. The patient expressed understanding and agreed to proceed.  The patient is at home, physician in the office.   History of Present Illness: Christian Coleman is a 68 year old right-handed white male with a history of an essential tremor.  The patient was last seen in April 2014 for the tremor.  The tremor affects both upper extremities, right more than left.  The patient does have a family history of tremor seen in the paternal grandfather, mother, and maternal grandmother.  Maternal grandmother may have had Parkinson's disease.  The patient has been on a multitude of medications that were not tolerated to include Topamax, Mysoline, and clonazepam.  The patient could not tolerate propranolol.  The patient has noted over the years that the tremor is gradually worsening.  He has been on magnesium supplementation with some modest benefit.  The patient is essentially unable to perform handwriting, he likes to do woodworking and arc welding, he has not been able to do much of this.  He cannot do any task that requires fine motor control.  He likes to play the guitar, he has given this up.  The patient uses 2 hands to eat.  He currently is retired.  His wife assists him with any handwriting that has to be done.  The patient has noted that when he is excited or tired, he may have some head and neck tremor and some occasional tremor in the voice.  The patient occasionally may have some tremor in the feet.  He denies any significant balance  changes, he has not had any falls.  He is sent back to this office for further evaluation.   Observations/Objective: The video evaluation reveals that the patient is alert and cooperative.  He at times appears to have minimal side-to-side head tremor.  The patient has good facial symmetry, he is able to protrude the tongue in the midline with good lateral movements of the tongue.  Speech is well enunciated, not aphasic or dysarthric.  No vocal tremor was noted.  The patient is able perform finger-nose-finger but he has prominent tremor on the right more so than the left.  The patient is able to walk normally, good tandem gait.  Romberg is negative.  He has good arm swing with walking.  When trying to draw a spiral, the patient has very prominent tremor translated into handwriting.  Assessment and Plan: 1.  Essential tremor  The patient has a relatively severe essential tremor at this point that does prevent him from performing several activities that he would like to continue doing.  Handwriting is severely impaired.  He has not tolerated multiple medications in the past.  It is unlikely that an oral medication will have any significant impact on a severe essential tremor.  We discussed the possibility of a deep brain stimulator, the patient will think about this potential treatment, he will contact me if he desires to have a consultation with a deep brain stimulator specialist.  Follow Up Instructions: Follow-up through this office if needed.  I discussed the assessment and treatment plan with the patient. The patient was provided an opportunity to ask questions and all were answered. The patient agreed with the plan and demonstrated an understanding of the instructions.   The patient was advised to call back or seek an in-person evaluation if the symptoms worsen or if the condition fails to improve as anticipated.  I provided 30 minutes of non-face-to-face time during this encounter.   York Spaniel, MD

## 2019-05-11 DIAGNOSIS — Z20828 Contact with and (suspected) exposure to other viral communicable diseases: Secondary | ICD-10-CM | POA: Diagnosis not present

## 2019-05-28 DIAGNOSIS — E559 Vitamin D deficiency, unspecified: Secondary | ICD-10-CM | POA: Diagnosis not present

## 2019-05-28 DIAGNOSIS — I10 Essential (primary) hypertension: Secondary | ICD-10-CM | POA: Diagnosis not present

## 2019-05-28 DIAGNOSIS — E782 Mixed hyperlipidemia: Secondary | ICD-10-CM | POA: Diagnosis not present

## 2019-05-28 DIAGNOSIS — D510 Vitamin B12 deficiency anemia due to intrinsic factor deficiency: Secondary | ICD-10-CM | POA: Diagnosis not present

## 2019-05-28 DIAGNOSIS — J309 Allergic rhinitis, unspecified: Secondary | ICD-10-CM | POA: Diagnosis not present

## 2019-05-28 DIAGNOSIS — E039 Hypothyroidism, unspecified: Secondary | ICD-10-CM | POA: Diagnosis not present

## 2019-05-28 DIAGNOSIS — Z23 Encounter for immunization: Secondary | ICD-10-CM | POA: Diagnosis not present

## 2019-05-28 DIAGNOSIS — G250 Essential tremor: Secondary | ICD-10-CM | POA: Diagnosis not present

## 2019-06-04 DIAGNOSIS — R69 Illness, unspecified: Secondary | ICD-10-CM | POA: Diagnosis not present

## 2019-06-11 DIAGNOSIS — R69 Illness, unspecified: Secondary | ICD-10-CM | POA: Diagnosis not present

## 2019-09-16 DIAGNOSIS — R69 Illness, unspecified: Secondary | ICD-10-CM | POA: Diagnosis not present

## 2019-11-09 ENCOUNTER — Ambulatory Visit: Payer: Medicare HMO | Attending: Internal Medicine

## 2019-11-09 DIAGNOSIS — Z23 Encounter for immunization: Secondary | ICD-10-CM | POA: Insufficient documentation

## 2019-11-09 NOTE — Progress Notes (Signed)
   Covid-19 Vaccination Clinic  Name:  Christian Coleman    MRN: [id number] DOB: [birth year]  11/09/2019  Christian Coleman was observed post Covid-19 immunization for 15 minutes without incidence. He was provided with Vaccine Information Sheet and instruction to access the V-Safe system.   Christian Coleman was instructed to call 911 with any severe reactions post vaccine: Marland Kitchen Difficulty breathing  . Swelling of your face and throat  . A fast heartbeat  . A bad rash all over your body  . Dizziness and weakness    Immunizations Administered    Name Date Dose VIS Date Route   Pfizer COVID-19 Vaccine 11/09/2019  9:57 AM 0.3 mL 08/30/2019 Intramuscular   Manufacturer: [employer], [employer]   Lot: [id number]   NDC: [address]-4

## 2019-12-03 ENCOUNTER — Ambulatory Visit: Payer: Medicare HMO | Attending: Internal Medicine

## 2019-12-03 DIAGNOSIS — Z23 Encounter for immunization: Secondary | ICD-10-CM

## 2019-12-03 NOTE — Progress Notes (Signed)
   Covid-19 Vaccination Clinic  Name:  Christian Coleman    MRN: [id number] DOB: 04-19-1951  12/03/2019  [name] was observed post Covid-19 immunization for 15 minutes without incident. He was provided with Vaccine Information Sheet and instruction to access the V-Safe system.   [name] was instructed to call 911 with any severe reactions post vaccine: Marland Kitchen Difficulty breathing  . Swelling of face and throat  . A fast heartbeat  . A bad rash all over body  . Dizziness and weakness   Immunizations Administered    Name Date Dose VIS Date Route   Pfizer COVID-19 Vaccine 12/03/2019  9:45 AM 0.3 mL 08/30/2019 Intramuscular   Manufacturer: [employer], [employer]   Lot: [id number]   NDC: [address]-4

## 2019-12-24 ENCOUNTER — Emergency Department (HOSPITAL_COMMUNITY): Payer: Medicare HMO

## 2019-12-24 ENCOUNTER — Inpatient Hospital Stay (HOSPITAL_COMMUNITY)
Admission: EM | Admit: 2019-12-24 | Discharge: 2019-12-30 | DRG: 200 | Disposition: A | Discharge status: Home Health | Payer: Medicare HMO | Attending: Emergency Medicine | Admitting: Emergency Medicine

## 2019-12-24 ENCOUNTER — Inpatient Hospital Stay (HOSPITAL_COMMUNITY): Payer: Medicare HMO

## 2019-12-24 ENCOUNTER — Other Ambulatory Visit: Payer: Self-pay

## 2019-12-24 ENCOUNTER — Encounter (HOSPITAL_COMMUNITY): Payer: Self-pay

## 2019-12-24 DIAGNOSIS — S2243XA Multiple fractures of ribs, bilateral, initial encounter for closed fracture: Secondary | ICD-10-CM | POA: Diagnosis not present

## 2019-12-24 DIAGNOSIS — G250 Essential tremor: Secondary | ICD-10-CM | POA: Diagnosis not present

## 2019-12-24 DIAGNOSIS — Z79890 Hormone replacement therapy (postmenopausal): Secondary | ICD-10-CM | POA: Diagnosis not present

## 2019-12-24 DIAGNOSIS — S42035A Nondisplaced fracture of lateral end of left clavicle, initial encounter for closed fracture: Secondary | ICD-10-CM | POA: Diagnosis not present

## 2019-12-24 DIAGNOSIS — E039 Hypothyroidism, unspecified: Secondary | ICD-10-CM | POA: Diagnosis present

## 2019-12-24 DIAGNOSIS — N179 Acute kidney failure, unspecified: Secondary | ICD-10-CM | POA: Diagnosis not present

## 2019-12-24 DIAGNOSIS — S299XXA Unspecified injury of thorax, initial encounter: Secondary | ICD-10-CM | POA: Diagnosis not present

## 2019-12-24 DIAGNOSIS — T797XXA Traumatic subcutaneous emphysema, initial encounter: Secondary | ICD-10-CM | POA: Diagnosis not present

## 2019-12-24 DIAGNOSIS — Z974 Presence of external hearing-aid: Secondary | ICD-10-CM | POA: Diagnosis not present

## 2019-12-24 DIAGNOSIS — M419 Scoliosis, unspecified: Secondary | ICD-10-CM | POA: Diagnosis present

## 2019-12-24 DIAGNOSIS — R0602 Shortness of breath: Secondary | ICD-10-CM | POA: Diagnosis not present

## 2019-12-24 DIAGNOSIS — J9811 Atelectasis: Secondary | ICD-10-CM | POA: Diagnosis not present

## 2019-12-24 DIAGNOSIS — Z9689 Presence of other specified functional implants: Secondary | ICD-10-CM

## 2019-12-24 DIAGNOSIS — S271XXA Traumatic hemothorax, initial encounter: Principal | ICD-10-CM | POA: Diagnosis present

## 2019-12-24 DIAGNOSIS — H9193 Unspecified hearing loss, bilateral: Secondary | ICD-10-CM | POA: Diagnosis present

## 2019-12-24 DIAGNOSIS — Z20822 Contact with and (suspected) exposure to covid-19: Secondary | ICD-10-CM | POA: Diagnosis not present

## 2019-12-24 DIAGNOSIS — I10 Essential (primary) hypertension: Secondary | ICD-10-CM | POA: Diagnosis present

## 2019-12-24 DIAGNOSIS — Z8249 Family history of ischemic heart disease and other diseases of the circulatory system: Secondary | ICD-10-CM | POA: Diagnosis not present

## 2019-12-24 DIAGNOSIS — Z03818 Encounter for observation for suspected exposure to other biological agents ruled out: Secondary | ICD-10-CM | POA: Diagnosis not present

## 2019-12-24 DIAGNOSIS — Z888 Allergy status to other drugs, medicaments and biological substances status: Secondary | ICD-10-CM

## 2019-12-24 DIAGNOSIS — Y9355 Activity, bike riding: Secondary | ICD-10-CM | POA: Diagnosis not present

## 2019-12-24 DIAGNOSIS — M47816 Spondylosis without myelopathy or radiculopathy, lumbar region: Secondary | ICD-10-CM | POA: Diagnosis present

## 2019-12-24 DIAGNOSIS — Z7982 Long term (current) use of aspirin: Secondary | ICD-10-CM

## 2019-12-24 DIAGNOSIS — Y92828 Other wilderness area as the place of occurrence of the external cause: Secondary | ICD-10-CM

## 2019-12-24 DIAGNOSIS — E785 Hyperlipidemia, unspecified: Secondary | ICD-10-CM | POA: Diagnosis present

## 2019-12-24 DIAGNOSIS — S270XXA Traumatic pneumothorax, initial encounter: Secondary | ICD-10-CM

## 2019-12-24 DIAGNOSIS — S42002A Fracture of unspecified part of left clavicle, initial encounter for closed fracture: Secondary | ICD-10-CM | POA: Diagnosis not present

## 2019-12-24 DIAGNOSIS — S2242XA Multiple fractures of ribs, left side, initial encounter for closed fracture: Secondary | ICD-10-CM | POA: Diagnosis not present

## 2019-12-24 DIAGNOSIS — M1990 Unspecified osteoarthritis, unspecified site: Secondary | ICD-10-CM | POA: Diagnosis not present

## 2019-12-24 DIAGNOSIS — J939 Pneumothorax, unspecified: Secondary | ICD-10-CM | POA: Diagnosis not present

## 2019-12-24 DIAGNOSIS — Z4682 Encounter for fitting and adjustment of non-vascular catheter: Secondary | ICD-10-CM | POA: Diagnosis not present

## 2019-12-24 DIAGNOSIS — S3991XA Unspecified injury of abdomen, initial encounter: Secondary | ICD-10-CM | POA: Diagnosis not present

## 2019-12-24 DIAGNOSIS — S42032A Displaced fracture of lateral end of left clavicle, initial encounter for closed fracture: Secondary | ICD-10-CM | POA: Diagnosis not present

## 2019-12-24 DIAGNOSIS — J90 Pleural effusion, not elsewhere classified: Secondary | ICD-10-CM | POA: Diagnosis not present

## 2019-12-24 LAB — CBC WITH DIFFERENTIAL/PLATELET
Abs Immature Granulocytes: 0.14 10*3/uL — ABNORMAL HIGH (ref 0.00–0.07)
Basophils Absolute: 0.1 10*3/uL (ref 0.0–0.1)
Basophils Relative: 0 %
Eosinophils Absolute: 0 10*3/uL (ref 0.0–0.5)
Eosinophils Relative: 0 %
HCT: 48 % (ref 39.0–52.0)
Hemoglobin: 15.8 g/dL (ref 13.0–17.0)
Immature Granulocytes: 1 %
Lymphocytes Relative: 5 %
Lymphs Abs: 1 10*3/uL (ref 0.7–4.0)
MCH: 32 pg (ref 26.0–34.0)
MCHC: 32.9 g/dL (ref 30.0–36.0)
MCV: 97.4 fL (ref 80.0–100.0)
Monocytes Absolute: 1.2 10*3/uL — ABNORMAL HIGH (ref 0.1–1.0)
Monocytes Relative: 7 %
Neutro Abs: 16 10*3/uL — ABNORMAL HIGH (ref 1.7–7.7)
Neutrophils Relative %: 87 %
Platelets: 344 10*3/uL (ref 150–400)
RBC: 4.93 MIL/uL (ref 4.22–5.81)
RDW: 12.9 % (ref 11.5–15.5)
WBC: 18.4 10*3/uL — ABNORMAL HIGH (ref 4.0–10.5)
nRBC: 0 % (ref 0.0–0.2)

## 2019-12-24 LAB — RESPIRATORY PANEL BY RT PCR (FLU A&B, COVID)
Influenza A by PCR: NEGATIVE
Influenza B by PCR: NEGATIVE
SARS Coronavirus 2 by RT PCR: NEGATIVE

## 2019-12-24 LAB — TYPE AND SCREEN
ABO/RH(D): O POS
Antibody Screen: NEGATIVE

## 2019-12-24 LAB — BASIC METABOLIC PANEL
Anion gap: 7 (ref 5–15)
BUN: 23 mg/dL (ref 8–23)
CO2: 26 mmol/L (ref 22–32)
Calcium: 9.4 mg/dL (ref 8.9–10.3)
Chloride: 107 mmol/L (ref 98–111)
Creatinine, Ser: 1.2 mg/dL (ref 0.61–1.24)
GFR calc Af Amer: >60 mL/min (ref >60)
GFR calc non Af Amer: >60 mL/min (ref >60)
Glucose, Bld: 213 mg/dL — ABNORMAL HIGH (ref 70–99)
Potassium: 4.6 mmol/L (ref 3.5–5.1)
Sodium: 140 mmol/L (ref 135–145)

## 2019-12-24 LAB — ABO/RH: ABO/RH(D): O POS

## 2019-12-24 LAB — MRSA PCR SCREENING: MRSA by PCR: NEGATIVE

## 2019-12-24 LAB — VITAMIN D 25 HYDROXY (VIT D DEFICIENCY, FRACTURES): Vit D, 25-Hydroxy: 34.53 ng/mL (ref 30–100)

## 2019-12-24 MED ORDER — POTASSIUM CHLORIDE 2 MEQ/ML IV SOLN
INTRAVENOUS | Status: DC
  Filled 2019-12-24 (×3): qty 1000

## 2019-12-24 MED ORDER — OXYCODONE HCL 5 MG PO TABS
5.0000 mg | ORAL_TABLET | ORAL | Status: DC | PRN
  Administered 2019-12-24 – 2019-12-26 (×5): 10 mg via ORAL
  Filled 2019-12-24 (×6): qty 2

## 2019-12-24 MED ORDER — SODIUM CHLORIDE (PF) 0.9 % IJ SOLN
INTRAMUSCULAR | Status: AC
  Filled 2019-12-24: qty 50

## 2019-12-24 MED ORDER — BOOST / RESOURCE BREEZE PO LIQD CUSTOM
1.0000 | Freq: Two times a day (BID) | ORAL | Status: DC
  Administered 2019-12-24 – 2019-12-26 (×3): 1 via ORAL

## 2019-12-24 MED ORDER — SENNOSIDES-DOCUSATE SODIUM 8.6-50 MG PO TABS
1.0000 | ORAL_TABLET | Freq: Every evening | ORAL | Status: DC | PRN
  Administered 2019-12-28: 1 via ORAL
  Filled 2019-12-24: qty 1

## 2019-12-24 MED ORDER — LIDOCAINE HCL 2 % IJ SOLN
INTRAMUSCULAR | Status: AC
  Administered 2019-12-24: 16:00:00 200 mg via INTRADERMAL
  Filled 2019-12-24: qty 20

## 2019-12-24 MED ORDER — SIMETHICONE 80 MG PO CHEW
40.0000 mg | CHEWABLE_TABLET | Freq: Four times a day (QID) | ORAL | Status: DC | PRN
  Filled 2019-12-24: qty 1

## 2019-12-24 MED ORDER — ENOXAPARIN SODIUM 40 MG/0.4ML ~~LOC~~ SOLN
40.0000 mg | SUBCUTANEOUS | Status: DC
  Administered 2019-12-24: 40 mg via SUBCUTANEOUS
  Filled 2019-12-24: qty 0.4

## 2019-12-24 MED ORDER — LIDOCAINE HCL 2 % IJ SOLN: 10.0000 mL | Freq: Once | INTRAMUSCULAR | Status: AC

## 2019-12-24 MED ORDER — LORAZEPAM 2 MG/ML IJ SOLN: 1.0000 mg | Freq: Four times a day (QID) | INTRAMUSCULAR | Status: DC | PRN

## 2019-12-24 MED ORDER — MORPHINE SULFATE (PF) 4 MG/ML IV SOLN
INTRAVENOUS | Status: AC
  Administered 2019-12-24: 4 mg via INTRAVENOUS
  Filled 2019-12-24: qty 1

## 2019-12-24 MED ORDER — LOSARTAN POTASSIUM 50 MG PO TABS
100.0000 mg | ORAL_TABLET | Freq: Every morning | ORAL | Status: DC
  Administered 2019-12-25 – 2019-12-30 (×6): 100 mg via ORAL
  Filled 2019-12-24 (×6): qty 2

## 2019-12-24 MED ORDER — METHOCARBAMOL 500 MG PO TABS
500.0000 mg | ORAL_TABLET | Freq: Three times a day (TID) | ORAL | Status: DC
  Administered 2019-12-24 – 2019-12-25 (×3): 500 mg via ORAL
  Filled 2019-12-24 (×3): qty 1

## 2019-12-24 MED ORDER — MORPHINE SULFATE (PF) 2 MG/ML IV SOLN
2.0000 mg | INTRAVENOUS | Status: DC | PRN
  Administered 2019-12-24: 3 mg via INTRAVENOUS
  Filled 2019-12-24: qty 2

## 2019-12-24 MED ORDER — LORAZEPAM 2 MG/ML IJ SOLN
1.0000 mg | Freq: Once | INTRAMUSCULAR | Status: AC
  Administered 2019-12-24: 1 mg via INTRAVENOUS
  Filled 2019-12-24: qty 1

## 2019-12-24 MED ORDER — BISACODYL 5 MG PO TBEC
5.0000 mg | DELAYED_RELEASE_TABLET | Freq: Every day | ORAL | Status: DC | PRN
  Administered 2019-12-27: 15:00:00 5 mg via ORAL
  Filled 2019-12-24 (×4): qty 1

## 2019-12-24 MED ORDER — ONDANSETRON 4 MG PO TBDP
4.0000 mg | ORAL_TABLET | Freq: Four times a day (QID) | ORAL | Status: DC | PRN
  Administered 2019-12-26: 4 mg via ORAL
  Filled 2019-12-24: qty 1

## 2019-12-24 MED ORDER — ACETAMINOPHEN 500 MG PO TABS
1000.0000 mg | ORAL_TABLET | Freq: Three times a day (TID) | ORAL | Status: DC
  Administered 2019-12-24 – 2019-12-25 (×3): 1000 mg via ORAL
  Filled 2019-12-24 (×3): qty 2

## 2019-12-24 MED ORDER — SODIUM CHLORIDE 0.9 % IV SOLN: INTRAVENOUS | Status: DC

## 2019-12-24 MED ORDER — MORPHINE SULFATE (PF) 4 MG/ML IV SOLN
4.0000 mg | Freq: Once | INTRAVENOUS | Status: AC
  Administered 2019-12-24: 4 mg via INTRAVENOUS
  Filled 2019-12-24: qty 1

## 2019-12-24 MED ORDER — LEVOTHYROXINE SODIUM 50 MCG PO TABS
175.0000 ug | ORAL_TABLET | Freq: Every day | ORAL | Status: DC
  Administered 2019-12-25 – 2019-12-30 (×6): 175 ug via ORAL
  Filled 2019-12-24 (×6): qty 1

## 2019-12-24 MED ORDER — VITAMIN D 25 MCG (1000 UNIT) PO TABS
2000.0000 [IU] | ORAL_TABLET | Freq: Every day | ORAL | Status: DC
  Administered 2019-12-25 – 2019-12-30 (×6): 2000 [IU] via ORAL
  Filled 2019-12-24 (×7): qty 2

## 2019-12-24 MED ORDER — IOHEXOL 300 MG/ML  SOLN
100.0000 mL | Freq: Once | INTRAMUSCULAR | Status: AC | PRN
  Administered 2019-12-24: 100 mL via INTRAVENOUS

## 2019-12-24 MED ORDER — MORPHINE SULFATE (PF) 2 MG/ML IV SOLN
2.0000 mg | Freq: Once | INTRAVENOUS | Status: AC
  Administered 2019-12-24: 15:00:00 2 mg via INTRAVENOUS
  Filled 2019-12-24: qty 1

## 2019-12-24 MED ORDER — MORPHINE SULFATE (PF) 4 MG/ML IV SOLN: 4.0000 mg | Freq: Once | INTRAVENOUS | Status: AC

## 2019-12-24 MED ORDER — FLEET ENEMA 7-19 GM/118ML RE ENEM
1.0000 | ENEMA | Freq: Once | RECTAL | Status: AC | PRN
  Administered 2019-12-28: 1 via RECTAL
  Filled 2019-12-24: qty 1

## 2019-12-24 MED ORDER — ONDANSETRON HCL 4 MG/2ML IJ SOLN
4.0000 mg | Freq: Four times a day (QID) | INTRAMUSCULAR | Status: DC | PRN
  Administered 2019-12-25 – 2019-12-26 (×2): 4 mg via INTRAVENOUS
  Filled 2019-12-24 (×2): qty 2

## 2019-12-24 MED ORDER — POLYETHYLENE GLYCOL 3350 17 G PO PACK
17.0000 g | PACK | Freq: Every day | ORAL | Status: DC | PRN
  Administered 2019-12-27 – 2019-12-28 (×2): 17 g via ORAL
  Filled 2019-12-24 (×2): qty 1

## 2019-12-24 MED ORDER — DOCUSATE SODIUM 100 MG PO CAPS
100.0000 mg | ORAL_CAPSULE | Freq: Two times a day (BID) | ORAL | Status: DC
  Administered 2019-12-24 – 2019-12-30 (×12): 100 mg via ORAL
  Filled 2019-12-24 (×12): qty 1

## 2019-12-24 NOTE — ED Notes (Signed)
Patient 89% on RA, placed on 2L.

## 2019-12-24 NOTE — ED Notes (Signed)
[facility] arrived for transport 

## 2019-12-24 NOTE — ED Notes (Signed)
Report called to [place], Charity fundraiser at 4W at [facility].

## 2019-12-24 NOTE — Op Note (Signed)
Chest Tube Insertion Procedure Note  Indications:  Clinically significant Pneumothorax  Pre-operative Diagnosis: Pneumothorax  Post-operative Diagnosis: Pneumothorax  Procedure Details  Informed consent was obtained for the procedure, including sedation.  Risks of lung perforation, hemorrhage, arrhythmia, and adverse drug reaction were discussed.   After sterile skin prep, using standard technique, a 14 French tube was placed in the left anterior 4th rib space.  Findings: Rush of air  Estimated Blood Loss:  Minimal         Specimens:  None              Complications:  None; patient tolerated the procedure well.         Disposition: ED in bed, feeling better         Condition: stable  Attending Attestation: I performed the procedure.

## 2019-12-24 NOTE — ED Notes (Addendum)
Ortho tech at bedside to place shoulder immobilizer.

## 2019-12-24 NOTE — Progress Notes (Signed)
Orthopedic Tech Progress Note Patient Details:  TREGAN READ 1951-09-15 [id number]  Ortho Devices Type of Ortho Device: Shoulder immobilizer Ortho Device/Splint Location: LUE Ortho Device/Splint Interventions: Ordered, Application, Adjustment   Post Interventions Patient Tolerated: Well Instructions Provided: Care of device, Adjustment of device   Cason Dabney N Shabreka Coulon 12/24/2019, 3:51 PM

## 2019-12-24 NOTE — ED Notes (Signed)
Patient transported to CT 
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
## 2019-12-24 NOTE — ED Notes (Signed)
Chest tube placed. Patient tolerated well.  

## 2019-12-24 NOTE — ED Notes (Signed)
Wife at bedside.

## 2019-12-24 NOTE — ED Notes (Addendum)
Chest tube kit set up at bedside.  Surgeon and PA at bedside to place chest tube.  Consent signed by patient and all questions addressed by surgeon and PA.
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
## 2019-12-25 ENCOUNTER — Inpatient Hospital Stay (HOSPITAL_COMMUNITY): Payer: Medicare HMO

## 2019-12-25 LAB — BASIC METABOLIC PANEL
Anion gap: 9 (ref 5–15)
BUN: 17 mg/dL (ref 8–23)
CO2: 24 mmol/L (ref 22–32)
Calcium: 8.5 mg/dL — ABNORMAL LOW (ref 8.9–10.3)
Chloride: 103 mmol/L (ref 98–111)
Creatinine, Ser: 1 mg/dL (ref 0.61–1.24)
GFR calc Af Amer: >60 mL/min (ref >60)
GFR calc non Af Amer: >60 mL/min (ref >60)
Glucose, Bld: 139 mg/dL — ABNORMAL HIGH (ref 70–99)
Potassium: 4 mmol/L (ref 3.5–5.1)
Sodium: 136 mmol/L (ref 135–145)

## 2019-12-25 LAB — CBC
HCT: 41.6 % (ref 39.0–52.0)
Hemoglobin: 13.8 g/dL (ref 13.0–17.0)
MCH: 31.9 pg (ref 26.0–34.0)
MCHC: 33.2 g/dL (ref 30.0–36.0)
MCV: 96.3 fL (ref 80.0–100.0)
Platelets: 281 10*3/uL (ref 150–400)
RBC: 4.32 MIL/uL (ref 4.22–5.81)
RDW: 13.2 % (ref 11.5–15.5)
WBC: 12 10*3/uL — ABNORMAL HIGH (ref 4.0–10.5)
nRBC: 0 % (ref 0.0–0.2)

## 2019-12-25 LAB — MAGNESIUM: Magnesium: 1.8 mg/dL (ref 1.7–2.4)

## 2019-12-25 MED ORDER — LIDOCAINE 5 % EX PTCH
1.0000 | MEDICATED_PATCH | CUTANEOUS | Status: DC
  Administered 2019-12-25 – 2019-12-30 (×6): 1 via TRANSDERMAL
  Filled 2019-12-25 (×6): qty 1

## 2019-12-25 MED ORDER — PANTOPRAZOLE SODIUM 40 MG PO TBEC
40.0000 mg | DELAYED_RELEASE_TABLET | Freq: Every day | ORAL | Status: DC
  Administered 2019-12-25: 40 mg via ORAL
  Filled 2019-12-25: qty 1

## 2019-12-25 MED ORDER — METHOCARBAMOL 500 MG PO TABS
1000.0000 mg | ORAL_TABLET | Freq: Three times a day (TID) | ORAL | Status: DC
  Administered 2019-12-25 – 2019-12-30 (×15): 1000 mg via ORAL
  Filled 2019-12-25 (×15): qty 2

## 2019-12-25 MED ORDER — MAGNESIUM SULFATE IN D5W 1-5 GM/100ML-% IV SOLN
1.0000 g | Freq: Once | INTRAVENOUS | Status: AC
  Administered 2019-12-25: 1 g via INTRAVENOUS
  Filled 2019-12-25: qty 100

## 2019-12-25 MED ORDER — GUAIFENESIN ER 600 MG PO TB12
600.0000 mg | ORAL_TABLET | Freq: Two times a day (BID) | ORAL | Status: DC
  Administered 2019-12-25 – 2019-12-30 (×11): 600 mg via ORAL
  Filled 2019-12-25 (×11): qty 1

## 2019-12-25 MED ORDER — ENOXAPARIN SODIUM 30 MG/0.3ML ~~LOC~~ SOLN
30.0000 mg | Freq: Two times a day (BID) | SUBCUTANEOUS | Status: DC
  Administered 2019-12-25 – 2019-12-29 (×9): 30 mg via SUBCUTANEOUS
  Filled 2019-12-25 (×10): qty 0.3

## 2019-12-25 MED ORDER — ACETAMINOPHEN 500 MG PO TABS
1000.0000 mg | ORAL_TABLET | Freq: Four times a day (QID) | ORAL | Status: DC
  Administered 2019-12-25 – 2019-12-30 (×19): 1000 mg via ORAL
  Filled 2019-12-25 (×21): qty 2

## 2019-12-25 MED ORDER — MORPHINE SULFATE (PF) 4 MG/ML IV SOLN: 4.0000 mg | INTRAVENOUS | Status: DC | PRN

## 2019-12-25 MED ORDER — IBUPROFEN 200 MG PO TABS
600.0000 mg | ORAL_TABLET | Freq: Four times a day (QID) | ORAL | Status: DC
  Administered 2019-12-25 – 2019-12-30 (×21): 600 mg via ORAL
  Filled 2019-12-25 (×22): qty 3

## 2019-12-25 NOTE — Social Work (Signed)
CSW met with pt bedside. CSW introduced herself and explained her role. CSW completed sbirt with pt. Pt scored a 0 on the scale and denied alcohol and substance use. Pt did not need resources at this time.   Emeterio Reeve, Latanya Presser, [place] Licensed Clinical Social Worker [phone]
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
## 2019-12-26 ENCOUNTER — Inpatient Hospital Stay (HOSPITAL_COMMUNITY): Payer: Medicare HMO

## 2019-12-26 MED ORDER — CYANOCOBALAMIN 500 MCG PO TABS
500.0000 ug | ORAL_TABLET | Freq: Every day | ORAL | Status: DC
  Administered 2019-12-27: 500 ug via ORAL
  Filled 2019-12-26 (×2): qty 1
  Filled 2019-12-26 (×2): qty 2
  Filled 2019-12-26: qty 1
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
## 2019-12-27 ENCOUNTER — Inpatient Hospital Stay (HOSPITAL_COMMUNITY): Payer: Medicare HMO

## 2019-12-27 LAB — BASIC METABOLIC PANEL
Anion gap: 13 (ref 5–15)
BUN: 39 mg/dL — ABNORMAL HIGH (ref 8–23)
CO2: 22 mmol/L (ref 22–32)
Calcium: 8.7 mg/dL — ABNORMAL LOW (ref 8.9–10.3)
Chloride: 98 mmol/L (ref 98–111)
Creatinine, Ser: 1.94 mg/dL — ABNORMAL HIGH (ref 0.61–1.24)
GFR calc Af Amer: 39 mL/min — ABNORMAL LOW (ref >60)
GFR calc non Af Amer: 34 mL/min — ABNORMAL LOW (ref >60)
Glucose, Bld: 100 mg/dL — ABNORMAL HIGH (ref 70–99)
Potassium: 4.3 mmol/L (ref 3.5–5.1)
Sodium: 133 mmol/L — ABNORMAL LOW (ref 135–145)

## 2019-12-27 LAB — CBC
HCT: 41.9 % (ref 39.0–52.0)
Hemoglobin: 14.1 g/dL (ref 13.0–17.0)
MCH: 32.6 pg (ref 26.0–34.0)
MCHC: 33.7 g/dL (ref 30.0–36.0)
MCV: 97 fL (ref 80.0–100.0)
Platelets: 251 10*3/uL (ref 150–400)
RBC: 4.32 MIL/uL (ref 4.22–5.81)
RDW: 13.1 % (ref 11.5–15.5)
WBC: 9.9 10*3/uL (ref 4.0–10.5)
nRBC: 0 % (ref 0.0–0.2)

## 2019-12-27 MED ORDER — KCL IN DEXTROSE-NACL 10-5-0.45 MEQ/L-%-% IV SOLN
INTRAVENOUS | Status: DC
  Filled 2019-12-27 (×4): qty 1000
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
## 2019-12-27 NOTE — Plan of Care (Signed)

## 2019-12-27 NOTE — Plan of Care (Signed)
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
## 2019-12-28 ENCOUNTER — Inpatient Hospital Stay (HOSPITAL_COMMUNITY): Payer: Medicare HMO

## 2019-12-28 LAB — BASIC METABOLIC PANEL
Anion gap: 12 (ref 5–15)
BUN: 35 mg/dL — ABNORMAL HIGH (ref 8–23)
CO2: 21 mmol/L — ABNORMAL LOW (ref 22–32)
Calcium: 8.6 mg/dL — ABNORMAL LOW (ref 8.9–10.3)
Chloride: 101 mmol/L (ref 98–111)
Creatinine, Ser: 1.6 mg/dL — ABNORMAL HIGH (ref 0.61–1.24)
GFR calc Af Amer: 49 mL/min — ABNORMAL LOW (ref >60)
GFR calc non Af Amer: 43 mL/min — ABNORMAL LOW (ref >60)
Glucose, Bld: 110 mg/dL — ABNORMAL HIGH (ref 70–99)
Potassium: 4 mmol/L (ref 3.5–5.1)
Sodium: 134 mmol/L — ABNORMAL LOW (ref 135–145)

## 2019-12-28 MED ORDER — SODIUM CHLORIDE 0.9 % IV SOLN: 250.0000 mL | INTRAVENOUS | Status: DC | PRN

## 2019-12-28 MED ORDER — SODIUM CHLORIDE 0.9% FLUSH
3.0000 mL | Freq: Two times a day (BID) | INTRAVENOUS | Status: DC
  Administered 2019-12-28 – 2019-12-30 (×5): 3 mL via INTRAVENOUS

## 2019-12-28 MED ORDER — SODIUM CHLORIDE 0.9% FLUSH: 3.0000 mL | INTRAVENOUS | Status: DC | PRN

## 2019-12-28 MED ORDER — CYANOCOBALAMIN 500 MCG PO LOZG
1.0000 | LOZENGE | Freq: Every day | ORAL | Status: DC
  Administered 2019-12-28 – 2019-12-30 (×3): 500 ug via ORAL
  Filled 2019-12-28 (×4): qty 1
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
## 2019-12-29 ENCOUNTER — Inpatient Hospital Stay (HOSPITAL_COMMUNITY): Payer: Medicare HMO
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
## 2019-12-30 ENCOUNTER — Inpatient Hospital Stay (HOSPITAL_COMMUNITY): Payer: Medicare HMO

## 2019-12-30 LAB — BASIC METABOLIC PANEL
Anion gap: 13 (ref 5–15)
BUN: 20 mg/dL (ref 8–23)
CO2: 22 mmol/L (ref 22–32)
Calcium: 8.9 mg/dL (ref 8.9–10.3)
Chloride: 104 mmol/L (ref 98–111)
Creatinine, Ser: 1.1 mg/dL (ref 0.61–1.24)
GFR calc Af Amer: >60 mL/min (ref >60)
GFR calc non Af Amer: >60 mL/min (ref >60)
Glucose, Bld: 101 mg/dL — ABNORMAL HIGH (ref 70–99)
Potassium: 4 mmol/L (ref 3.5–5.1)
Sodium: 139 mmol/L (ref 135–145)

## 2019-12-30 MED ORDER — OXYCODONE HCL 5 MG PO TABS: 5.0000 mg | ORAL_TABLET | Freq: Four times a day (QID) | ORAL | 0 refills | Status: AC | PRN

## 2019-12-30 MED ORDER — METHOCARBAMOL 500 MG PO TABS: 1000.0000 mg | ORAL_TABLET | Freq: Three times a day (TID) | ORAL | 0 refills | Status: AC | PRN

## 2019-12-30 MED ORDER — LIDOCAINE 5 % EX PTCH: 1.0000 | MEDICATED_PATCH | Freq: Every day | CUTANEOUS | 0 refills | Status: AC | PRN

## 2019-12-30 MED ORDER — GUAIFENESIN ER 600 MG PO TB12: 600.0000 mg | ORAL_TABLET | Freq: Two times a day (BID) | ORAL | Status: AC | PRN

## 2019-12-30 MED ORDER — IBUPROFEN 200 MG PO TABS: 600.0000 mg | ORAL_TABLET | Freq: Three times a day (TID) | ORAL | Status: AC | PRN

## 2019-12-30 MED ORDER — ACETAMINOPHEN 500 MG PO TABS: 1000.0000 mg | ORAL_TABLET | Freq: Four times a day (QID) | ORAL | 0 refills | Status: AC

## 2019-12-30 NOTE — Discharge Instructions (Addendum)
PNEUMOTHORAX OR HEMOTHORAX +/- RIB FRACTURES  HOME INSTRUCTIONS   1. PAIN CONTROL:  1. Pain is best controlled by a usual combination of three different methods TOGETHER:  i. Ice/Heat ii. Over the counter pain medication iii. Prescription pain medication 2. You may experience some swelling and bruising in area of broken ribs. Ice packs or heating pads (30-60 minutes up to 6 times a day) will help. Use ice for the first few days to help decrease swelling and bruising, then switch to heat to help relax tight/sore spots and speed recovery. Some people prefer to use ice alone, heat alone, alternating between ice & heat. Experiment to what works for you. Swelling and bruising can take several weeks to resolve.  3. It is helpful to take an over-the-counter pain medication regularly for the first few weeks. Choose one of the following that works best for you:  i. Naproxen (Aleve, etc) Two 220mg  tabs twice a day ii. Ibuprofen (Advil, etc) Three 200mg  tabs four times a day (every meal & bedtime) iii. Acetaminophen (Tylenol, etc) 500-650mg  four times a day (every meal & bedtime) 4. A prescription for pain medication (such as oxycodone, hydrocodone, etc) may be given to you upon discharge. Take your pain medication as prescribed.  i. If you are having problems/concerns with the prescription medicine (does not control pain, nausea, vomiting, rash, itching, etc), please call [phone] to see if we need to switch you to a different pain medicine that will work better for you and/or control your side effect better. ii. If you need a refill on your pain medication, please contact your pharmacy. They will contact our office to request authorization. Prescriptions will not be filled after 5 pm or on week-ends. 1. Avoid getting constipated. When taking pain medications, it is common to experience some constipation. Increasing fluid intake and taking a fiber supplement (such as Metamucil, Citrucel, FiberCon,  MiraLax, etc) 1-2 times a day regularly will usually help prevent this problem from occurring. A mild laxative (prune juice, Milk of Magnesia, MiraLax, etc) should be taken according to package directions if there are no bowel movements after 48 hours.  2. Watch out for diarrhea. If you have many loose bowel movements, simplify your diet to bland foods & liquids for a few days. Stop any stool softeners and decrease your fiber supplement. Switching to mild anti-diarrheal medications (Kayopectate, Pepto Bismol) can help. If this worsens or does not improve, please call us. 3. Chest tube site wound: you may remove the dressing from your chest tube site 3 days after the removal of your chest tube. DO NOT shower over the dressing. Once   removed, you may shower as normal. Do not submerge your wound in water for 2-3 weeks.  4. FOLLOW UP  a. Please call our office to set up or confirm an appointment for follow up for 2 weeks after discharge. You will need to get a chest xray at either [facility] or [facility]. This will be outlined in your follow up instructions. Please call [facility] at [phone] if you have any questions about follow up.  b. If you have any orthopedic or other injuries you will need to follow up as outlined in your follow up instructions.   WHEN TO CALL [facility] [phone]:  1. Poor pain control 2. Reactions / problems with new medications (rash/itching, nausea, etc)  3. Fever over 101.5 F (38.5 C) 4. Worsening swelling or bruising 5. Redness, drainage, pain or swelling around chest  tube site 6. Worsening pain, productive cough, difficulty breathing or any other concerning symptoms  The clinic staff is available to answer your questions during regular business hours (8:30am-5pm). Please don't hesitate to call and ask to speak to one of our nurses for clinical concerns.  If you have a medical emergency, go to the nearest emergency room or call 911.  A surgeon from [facility] is always on call at the hospitals   [facility]  [address], [address], [place], [place] [address] ?  MAIN: [phone] ? TOLL FREE: [phone] ?  FAX [phone]  [email or web address]      Information on Rib Fractures  A rib fracture is a break or crack in one of the bones of the ribs. The ribs are long, curved bones that wrap around your chest and attach to your spine and your breastbone. The ribs protect your heart, lungs, and other organs in the chest. A broken or cracked rib is often painful but is not usually serious. Most rib fractures heal on their own over time. However, rib fractures can be more serious if multiple ribs are broken or if broken ribs move out of place and push against other structures or organs. What are the causes? This condition is caused by:  Repetitive movements with high force, such as pitching a baseball or having severe coughing spells.  A direct blow to the chest, such as a sports injury, a car accident, or a fall.  Cancer that has spread to the bones, which can weaken bones and cause them to break. What are the signs or symptoms? Symptoms of this condition include:  Pain when you breathe in or cough.  Pain when someone presses on the injured area.  Feeling short of breath. How is this diagnosed? This condition is diagnosed with a physical exam and medical history. Imaging tests may also be done, such as:  Chest X-ray.  CT scan.  MRI.  Bone scan.  Chest ultrasound. How is this treated? Treatment for this condition depends on the severity of the fracture. Most rib fractures usually heal on their own in 1-3 months. Sometimes healing takes longer if there is a cough that does not stop or if there are other activities that make the injury worse (aggravating factors). While you heal, you will be given medicines to control the pain. You will also be taught deep breathing  exercises. Severe injuries may require hospitalization or surgery. Follow these instructions at home: Managing pain, stiffness, and swelling  If directed, apply ice to the injured area. ? Put ice in a plastic bag. ? Place a towel between your skin and the bag. ? Leave the ice on for 20 minutes, 2-3 times a day.  Take over-the-counter and prescription medicines only as told by your health care provider. Activity  Avoid a lot of activity and any activities or movements that cause pain. Be careful during activities and avoid bumping the injured rib.  Slowly increase your activity as told by your health care provider. General instructions  Do deep breathing exercises as told by your health care provider. This helps prevent pneumonia, which is a common complication of a broken rib. Your health care provider may instruct you to: ? Take deep breaths several times a day. ? Try to cough several times a day, holding a pillow against the injured area. ? Use a device called incentive spirometer to practice deep breathing several times a day.  Drink enough   fluid to keep your urine pale yellow.  Do not wear a rib belt or binder. These restrict breathing, which can lead to pneumonia.  Keep all follow-up visits as told by your health care provider. This is important. Contact a health care provider if:  You have a fever. Get help right away if:  You have difficulty breathing or you are short of breath.  You develop a cough that does not stop, or you cough up thick or bloody sputum.  You have nausea, vomiting, or pain in your abdomen.  Your pain gets worse and medicine does not help. Summary  A rib fracture is a break or crack in one of the bones of the ribs.  A broken or cracked rib is often painful but is not usually serious.  Most rib fractures heal on their own over time.  Treatment for this condition depends on the severity of the fracture.  Avoid a lot of activity and any  activities or movements that cause pain. This information is not intended to replace advice given to you by your health care provider. Make sure you discuss any questions you have with your health care provider. Document Released: 09/05/2005 Document Revised: 12/05/2016 Document Reviewed: 12/05/2016 Elsevier Interactive Patient Education  2019 Elsevier Inc.    Pneumothorax A pneumothorax is commonly called a collapsed lung. It is a condition in which air leaks from a lung and builds up between the thin layer of tissue that covers the lungs (visceral pleura) and the interior wall of the chest cavity (parietal pleura). The air gets trapped outside the lung, between the lung and the chest wall (pleural space). The air takes up space and prevents the lung from fully expanding. This condition sometimes occurs suddenly with no apparent cause. The buildup of air may be small or large. A small pneumothorax may go away on its own. A large pneumothorax will require treatment and hospitalization. What are the causes? This condition may be caused by:  Trauma and injury to the chest wall.  Surgery and other medical procedures.  A complication of an underlying lung problem, especially chronic obstructive pulmonary disease (COPD) or emphysema. Sometimes the cause of this condition is not known. What increases the risk? You are more likely to develop this condition if:  You have an underlying lung problem.  You smoke.  You are 20-40 years old, male, tall, and underweight.  You have a personal or family history of pneumothorax.  You have an eating disorder (anorexia nervosa). This condition can also happen quickly, even in people with no history of lung problems. What are the signs or symptoms? Sometimes a pneumothorax will have no symptoms. When symptoms are present, they can include:  Chest pain.  Shortness of breath.  Increased rate of breathing.  Bluish color to your lips or skin  (cyanosis). How is this diagnosed? This condition may be diagnosed by:  A medical history and physical exam.  A chest X-ray, chest CT scan, or ultrasound. How is this treated? Treatment depends on how severe your condition is. The goal of treatment is to remove the extra air and allow your lung to expand back to its normal size.  For a small pneumothorax: ? No treatment may be needed. ? Extra oxygen is sometimes used to make it go away more quickly.  For a large pneumothorax or a pneumothorax that is causing symptoms, a procedure is done to drain the air from your lungs. To do this, a health care provider may   use: ? A needle with a syringe. This is used to suck air from a pleural space where no additional leakage is taking place. ? A chest tube. This is used to suck air where there is ongoing leakage into the pleural space. The chest tube may need to remain in place for several days until the air leak has healed.  In more severe cases, surgery may be needed to repair the damage that is causing the leak.  If you have multiple pneumothorax episodes or have an air leak that will not heal, a procedure called a pleurodesis may be done. A medicine is placed in the pleural space to irritate the tissues around the lung so that the lung will stick to the chest wall, seal any leaks, and stop any buildup of air in that space. If you have an underlying lung problem, severe symptoms, or a large pneumothorax you will usually need to stay in the hospital. Follow these instructions at home: Lifestyle  Do not use any products that contain nicotine or tobacco, such as cigarettes and e-cigarettes. These are major risk factors in pneumothorax. If you need help quitting, ask your health care provider.  Do not lift anything that is heavier than 10 lb (4.5 kg), or the limit that your health care provider tells you, until he or she says that it is safe.  Avoid activities that take a lot of effort (strenuous)  for as long as told by your health care provider.  Return to your normal activities as told by your health care provider. Ask your health care provider what activities are safe for you.  Do not fly in an airplane or scuba dive until your health care provider says it is okay. General instructions  Take over-the-counter and prescription medicines only as told by your health care provider.  If a cough or pain makes it difficult for you to sleep at night, try sleeping in a semi-upright position in a recliner or by using 2 or 3 pillows.  If you had a chest tube and it was removed, ask your health care provider when you can remove the bandage (dressing). While the dressing is in place, do not allow it to get wet.  Keep all follow-up visits as told by your health care provider. This is important. Contact a health care provider if:  You cough up thick mucus (sputum) that is yellow or green in color.  You were treated with a chest tube, and you have redness, increasing pain, or discharge at the site where it was placed. Get help right away if:  You have increasing chest pain or shortness of breath.  You have a cough that will not go away.  You begin coughing up blood.  You have pain that is getting worse or is not controlled with medicines.  The site where your chest tube was located opens up.  You feel air coming out of the site where the chest tube was placed.  You have a fever or persistent symptoms for more than 2-3 days.  You have a fever and your symptoms suddenly get worse. These symptoms may represent a serious problem that is an emergency. Do not wait to see if the symptoms will go away. Get medical help right away. Call your local emergency services (911 in the [place] Do not drive yourself to the hospital. Summary  A pneumothorax, commonly called a collapsed lung, is a condition in which air leaks from a lung and gets trapped between the   lung and the chest wall (pleural  space).  The buildup of air may be small or large. A small pneumothorax may go away on its own. A large pneumothorax will require treatment and hospitalization.  Treatment for this condition depends on how severe the pneumothorax is. The goal of treatment is to remove the extra air and allow the lung to expand back to its normal size. This information is not intended to replace advice given to you by your health care provider. Make sure you discuss any questions you have with your health care provider. Document Released: 09/05/2005 Document Revised: 08/14/2017 Document Reviewed: 08/14/2017 Elsevier Interactive Patient Education  2019 Elsevier Inc.   Clavicle Fracture Remain non weight bearing to your left upper extremity until you are seen by the orthopedist  A clavicle fracture is a break in the long bone that connects the shoulder to the chest wall (clavicle). The clavicle is also called the collarbone. A clavicle fracture is a common injury that can happen at any age. What are the causes? Common causes of a clavicle fracture include:  A hard, direct hit (blow) to the shoulder.  A car accident.  A fall, especially if you try to break your fall with an outstretched arm. What increases the risk? You are more likely to develop this condition if:  You are younger than 77 or older than 75. Most clavicle fractures happen to people who are younger than 25.  You are a male.  You play contact sports. What are the signs or symptoms? Symptoms of this condition include:  Pain.  Difficulty moving the arm.  A shoulder that drops downward and forward.  Pain when trying to lift the shoulder.  Bruising, swelling, and tenderness over the clavicle.  A grinding noise when you try to move the shoulder.  A bump over the clavicle. How is this diagnosed? This condition is diagnosed based on:  Your medical history.  A physical exam.  X-rays to determine the position of your  clavicle. How is this treated? Treatment for this condition depends on the position of your clavicle after the fracture:  If the broken ends of the bone are not out of place, your health care provider may put your arm in a sling.  If the broken ends of the bone are out of place, you may need surgery. Surgery may involve placing screws, pins, or plates to keep your clavicle stable while it heals. When your health care provider thinks your fracture has healed enough, you may have to do physical therapy to regain normal movement and build up your arm strength. Follow these instructions at home: If you have a sling:   Wear the sling as told by your health care provider. Remove it only as told by your health care provider.  Loosen the sling if your fingers tingle, become numb, or turn cold and blue.  Do not lift your arm. Keep it across your chest.  Keep the sling clean.  Ask your health care provider if you may remove the sling for bathing. ? If your sling is not waterproof, do not let it get wet. Cover the sling with a watertight covering if you take a bath or a shower while wearing it. ? If you may remove your sling when you take a bath or a shower, keep your shoulder in the same position as when the sling is on. Managing pain, stiffness, and swelling   If directed, put ice on the injured area: ? If you  have a removable sling, remove it as told by your health care provider. ? Put ice in a plastic bag. ? Place a towel between your skin and the bag. ? Leave the ice on for 20 minutes, 2-3 times a day. Activity  Avoid activities that make your symptoms worse for 4-6 weeks, or as long as directed.  Ask your health care provider when it is safe for you to drive.  Do exercises as told by your health care provider. General instructions  Do not use any products that contain nicotine or tobacco, such as cigarettes and e-cigarettes. These can delay bone healing. If you need help quitting,  ask your health care provider.  Take over-the-counter and prescription medicines only as told by your health care provider.  Keep all follow-up visits as told by your health care provider. This is important. Contact a health care provider if:  Your medicine is not helping to relieve pain and swelling. Get help right away if:  Your arm is numb, cold, or pale, even when your splint is loose. Summary  The clavicle, also called the collarbone, is the long bone that connects the shoulder to the chest wall.  Treatment for this condition depends on the position of your clavicle after the fracture.  You may need to do physical therapy after your injury has healed enough.  If you have a sling, wear the sling as told by your health care provider. This information is not intended to replace advice given to you by your health care provider. Make sure you discuss any questions you have with your health care provider. Document Revised: 07/23/2018 Document Reviewed: 07/25/2016 Elsevier Patient Education  [place].   How To Use a Sling A sling is a type of hanging bandage that is worn around the neck to protect an injured arm, shoulder, or other body part. A sling may be needed to prevent movement of (to immobilize) the injured body part while it heals. Keeping the injured body part still can lessen pain and speed up healing. A health care provider may recommend using a sling:  To treat a broken arm or collarbone.  To treat a shoulder injury.  After surgery. What are the risks? In general, wearing a sling helps with safe healing. However, in some cases, wearing a sling the wrong way can:  Make the injury worse.  Cause stiffness or numbness.  Affect blood flow (circulation) in the arm and hand. This can cause tingling or numbness in the fingers or hands. How to use a sling Follow instructions from your health care provider about how and when to wear your sling. Your health care  provider will show you or tell you:  How to put on the sling.  How to adjust the sling.  When and how often to wear the sling.  How to remove the sling. The way that you need to use a sling depends on your injury. Unless your health care provider gives you different instructions, you should:  Wear the sling so that your elbow bends to the shape of a capital letter "L" (90 degrees, or a right angle).  Make sure the sling supports your wrist and your hand.  Adjust the sling if your fingers or hand start to tingle or feel numb. Follow these instructions at home:  Try to avoid moving your arm.  Do not twist, lift, or move your arm in a way that could make your injury worse.  Do not lean on your  arm while wearing a sling.  Do not lift anything with the hand or arm that is in the sling. Contact a health care provider if:  You have bruising, swelling, or pain that gets worse.  You have pain that does not get better with medicine.  You have a fever.  Your sling is not supporting your arm properly.  Your sling gets damaged. Get help right away if you:  Have numbness or tingling in your fingers.  Notice that your fingers turn blue or feel cold to the touch.  Cannot control bleeding from your injury.  Have shortness of breath. Summary  A sling is a type of hanging bandage that is worn around the neck to protect an injured arm, shoulder, or other body part. A sling may be needed to prevent movement of (to immobilize) the injured body part while it heals.  The way that you use a sling depends on your injury. Carefully follow instructions from your health care provider.  A good general rule is to wear the sling so that your arms bends 90 degrees (at a right angle) at the elbow. That is like the shape of a capital letter "L."  Make sure you know which problems should cause you to contact your health care provider or get help right away. This information is not intended to  replace advice given to you by your health care provider. Make sure you discuss any questions you have with your health care provider. Document Revised: 08/18/2017 Document Reviewed: 07/27/2017 Elsevier Patient Education  2020 [employer]   ........Marland Kitchen.   Managing Your Pain After Surgery Without Opioids    Thank you for participating in our program to help patients manage their pain awithout opioids. This is part of our effort to provide you with the best care possible, without exposing you or your family to the risk that opioids pose.  Many studies have found that many patients are able to manage their pain after surgery with Over-the-Counter (OTC) medications such as Tylenol and Motrin. If you have a condition that does not allow you to take Tylenol or Motrin, notify your surgical team.  How will I manage my pain? The best strategy for controlling your pain after surgery is around the clock pain control with Tylenol (acetaminophen) and Motrin (ibuprofen or Advil). Alternating these medications with each other allows you to maximize your pain control. In addition to Tylenol and Motrin, you can use heating pads or ice packs on your incisions to help reduce your pain.  How will I alternate your regular strength over-the-counter pain medication? You will take a dose of pain medication every three hours. ; Start by taking 650 mg of Tylenol (2 pills of 325 mg) ; 3 hours later take 600 mg of Motrin (3 pills of 200 mg) ; 3 hours after taking the Motrin take 650 mg of Tylenol ; 3 hours after that take 600 mg of Motrin.   - 1 -  See example - if your first dose of Tylenol is at 12:00 PM   12:00 PM Tylenol 650 mg (2 pills of 325 mg)  3:00 PM Motrin 600 mg (3 pills of 200 mg)  6:00 PM Tylenol 650 mg (2 pills of 325 mg)  9:00 PM Motrin 600 mg (3 pills of 200 mg)  Continue alternating every 3 hours   We recommend that you follow this schedule around-the-clock for at least 3 days after  surgery, or until you feel that it is no longer  needed. Use the table on the last page of this handout to keep track of the medications you are taking. Important: Do not take more than  of Tylenol or  of Motrin in a 24-hour period. Do not take ibuprofen/Motrin if you have a history of bleeding stomach ulcers, severe kidney disease, &/or actively taking a blood thinner  What if I still have pain? If you have pain that is not controlled with the over-the-counter pain medications (Tylenol and Motrin or Advil) you might have what we call "breakthrough" pain. You will receive a prescription for a small amount of an opioid pain medication such as Oxycodone, Tramadol, or Tylenol with Codeine. Use these opioid pills in the first 24 hours after surgery if you have breakthrough pain. Do not take more than 1 pill every 4-6 hours.  If you still have uncontrolled pain after using all opioid pills, don't hesitate to call our staff using the number provided. We will help make sure you are managing your pain in the best way possible, and if necessary, we can provide a prescription for additional pain medication.   Day 1    Time  Name of Medication Number of pills taken  Amount of Acetaminophen  Pain Level   Comments  AM PM       AM PM       AM PM       AM PM       AM PM       AM PM       AM PM       AM PM       Total Daily amount of Acetaminophen Do not take more than  3,000 mg per day      Day 2    Time  Name of Medication Number of pills taken  Amount of Acetaminophen  Pain Level   Comments  AM PM       AM PM       AM PM       AM PM       AM PM       AM PM       AM PM       AM PM       Total Daily amount of Acetaminophen Do not take more than  3,000 mg per day      Day 3    Time  Name of Medication Number of pills taken  Amount of Acetaminophen  Pain Level   Comments  AM PM       AM PM       AM PM       AM PM          AM PM       AM PM       AM PM        AM PM       Total Daily amount of Acetaminophen Do not take more than  3,000 mg per day      Day 4    Time  Name of Medication Number of pills taken  Amount of Acetaminophen  Pain Level   Comments  AM PM       AM PM       AM PM       AM PM       AM PM       AM PM       AM PM  AM PM       Total Daily amount of Acetaminophen Do not take more than  3,000 mg per day      Day 5    Time  Name of Medication Number of pills taken  Amount of Acetaminophen  Pain Level   Comments  AM PM       AM PM       AM PM       AM PM       AM PM       AM PM       AM PM       AM PM       Total Daily amount of Acetaminophen Do not take more than  3,000 mg per day       Day 6    Time  Name of Medication Number of pills taken  Amount of Acetaminophen  Pain Level  Comments  AM PM       AM PM       AM PM       AM PM       AM PM       AM PM       AM PM       AM PM       Total Daily amount of Acetaminophen Do not take more than  3,000 mg per day      Day 7    Time  Name of Medication Number of pills taken  Amount of Acetaminophen  Pain Level   Comments  AM PM       AM PM       AM PM       AM PM       AM PM       AM PM       AM PM       AM PM       Total Daily amount of Acetaminophen Do not take more than  3,000 mg per day        For additional information about how and where to safely dispose of unused opioid medications - [email or web address]  Disclaimer: This document contains information and/or instructional materials adapted from [facility] for the typical patient with your condition. It does not replace medical advice from your health care provider because your experience may differ from that of the typical patient. Talk to your health care provider if you have any questions about this document, your condition or your treatment plan. Adapted from [facility]
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
## 2019-12-30 NOTE — Progress Notes (Signed)
Patients chest tube was removed. Patient tolerated well. Patient is setting up in chair. Call bell in reach.

## 2019-12-30 NOTE — Progress Notes (Signed)
Patient discharged to home. IV removed and all patients belongings sent home. Patient was educated on all D/C orders, appt and medication changes, all question answered. Not printed Rx to give equipment delivered to room and placed in patients car for home.
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
## 2020-01-07 DIAGNOSIS — R0781 Pleurodynia: Secondary | ICD-10-CM | POA: Diagnosis not present

## 2020-01-07 DIAGNOSIS — M25512 Pain in left shoulder: Secondary | ICD-10-CM | POA: Diagnosis not present

## 2020-01-10 DIAGNOSIS — S42035D Nondisplaced fracture of lateral end of left clavicle, subsequent encounter for fracture with routine healing: Secondary | ICD-10-CM | POA: Diagnosis not present

## 2020-01-10 DIAGNOSIS — Z8709 Personal history of other diseases of the respiratory system: Secondary | ICD-10-CM | POA: Diagnosis not present

## 2020-01-10 DIAGNOSIS — S2242XD Multiple fractures of ribs, left side, subsequent encounter for fracture with routine healing: Secondary | ICD-10-CM | POA: Diagnosis not present

## 2020-01-15 ENCOUNTER — Other Ambulatory Visit: Payer: Self-pay

## 2020-01-15 ENCOUNTER — Other Ambulatory Visit: Payer: Self-pay | Admitting: Physician Assistant

## 2020-01-15 ENCOUNTER — Ambulatory Visit
Admission: RE | Admit: 2020-01-15 | Discharge: 2020-01-15 | Disposition: A | Discharge status: Home / Self Care | Payer: Medicare HMO | Source: Ambulatory Visit | Attending: Physician Assistant | Admitting: Physician Assistant

## 2020-01-15 DIAGNOSIS — J939 Pneumothorax, unspecified: Secondary | ICD-10-CM | POA: Diagnosis not present

## 2020-01-15 DIAGNOSIS — S2242XD Multiple fractures of ribs, left side, subsequent encounter for fracture with routine healing: Secondary | ICD-10-CM | POA: Diagnosis not present

## 2020-01-15 DIAGNOSIS — J9383 Other pneumothorax: Secondary | ICD-10-CM

## 2020-01-16 DIAGNOSIS — J939 Pneumothorax, unspecified: Secondary | ICD-10-CM | POA: Diagnosis not present

## 2020-01-16 DIAGNOSIS — S42002A Fracture of unspecified part of left clavicle, initial encounter for closed fracture: Secondary | ICD-10-CM | POA: Diagnosis not present

## 2020-01-24 DIAGNOSIS — J302 Other seasonal allergic rhinitis: Secondary | ICD-10-CM | POA: Diagnosis not present

## 2020-01-24 DIAGNOSIS — J029 Acute pharyngitis, unspecified: Secondary | ICD-10-CM | POA: Diagnosis not present

## 2020-02-04 DIAGNOSIS — M25512 Pain in left shoulder: Secondary | ICD-10-CM | POA: Diagnosis not present

## 2020-02-06 DIAGNOSIS — M25612 Stiffness of left shoulder, not elsewhere classified: Secondary | ICD-10-CM | POA: Diagnosis not present

## 2020-02-06 DIAGNOSIS — M6281 Muscle weakness (generalized): Secondary | ICD-10-CM | POA: Diagnosis not present

## 2020-02-06 DIAGNOSIS — M25512 Pain in left shoulder: Secondary | ICD-10-CM | POA: Diagnosis not present

## 2020-02-06 DIAGNOSIS — S42022D Displaced fracture of shaft of left clavicle, subsequent encounter for fracture with routine healing: Secondary | ICD-10-CM | POA: Diagnosis not present

## 2020-02-10 DIAGNOSIS — M25612 Stiffness of left shoulder, not elsewhere classified: Secondary | ICD-10-CM | POA: Diagnosis not present

## 2020-02-10 DIAGNOSIS — S42022D Displaced fracture of shaft of left clavicle, subsequent encounter for fracture with routine healing: Secondary | ICD-10-CM | POA: Diagnosis not present

## 2020-02-10 DIAGNOSIS — M6281 Muscle weakness (generalized): Secondary | ICD-10-CM | POA: Diagnosis not present

## 2020-02-10 DIAGNOSIS — M25512 Pain in left shoulder: Secondary | ICD-10-CM | POA: Diagnosis not present

## 2020-02-11 DIAGNOSIS — D510 Vitamin B12 deficiency anemia due to intrinsic factor deficiency: Secondary | ICD-10-CM | POA: Diagnosis not present

## 2020-02-11 DIAGNOSIS — E559 Vitamin D deficiency, unspecified: Secondary | ICD-10-CM | POA: Diagnosis not present

## 2020-02-11 DIAGNOSIS — Z1211 Encounter for screening for malignant neoplasm of colon: Secondary | ICD-10-CM | POA: Diagnosis not present

## 2020-02-11 DIAGNOSIS — E782 Mixed hyperlipidemia: Secondary | ICD-10-CM | POA: Diagnosis not present

## 2020-02-11 DIAGNOSIS — E039 Hypothyroidism, unspecified: Secondary | ICD-10-CM | POA: Diagnosis not present

## 2020-02-11 DIAGNOSIS — G250 Essential tremor: Secondary | ICD-10-CM | POA: Diagnosis not present

## 2020-02-11 DIAGNOSIS — J309 Allergic rhinitis, unspecified: Secondary | ICD-10-CM | POA: Diagnosis not present

## 2020-02-11 DIAGNOSIS — I10 Essential (primary) hypertension: Secondary | ICD-10-CM | POA: Diagnosis not present

## 2020-02-11 DIAGNOSIS — Z0000 Encounter for general adult medical examination without abnormal findings: Secondary | ICD-10-CM | POA: Diagnosis not present

## 2020-02-11 DIAGNOSIS — Z125 Encounter for screening for malignant neoplasm of prostate: Secondary | ICD-10-CM | POA: Diagnosis not present

## 2020-02-12 DIAGNOSIS — Z1211 Encounter for screening for malignant neoplasm of colon: Secondary | ICD-10-CM | POA: Diagnosis not present

## 2020-02-13 DIAGNOSIS — S42022D Displaced fracture of shaft of left clavicle, subsequent encounter for fracture with routine healing: Secondary | ICD-10-CM | POA: Diagnosis not present

## 2020-02-13 DIAGNOSIS — M6281 Muscle weakness (generalized): Secondary | ICD-10-CM | POA: Diagnosis not present

## 2020-02-13 DIAGNOSIS — M25612 Stiffness of left shoulder, not elsewhere classified: Secondary | ICD-10-CM | POA: Diagnosis not present

## 2020-02-13 DIAGNOSIS — M25512 Pain in left shoulder: Secondary | ICD-10-CM | POA: Diagnosis not present

## 2020-02-19 DIAGNOSIS — M6281 Muscle weakness (generalized): Secondary | ICD-10-CM | POA: Diagnosis not present

## 2020-02-19 DIAGNOSIS — M25512 Pain in left shoulder: Secondary | ICD-10-CM | POA: Diagnosis not present

## 2020-02-19 DIAGNOSIS — S42022D Displaced fracture of shaft of left clavicle, subsequent encounter for fracture with routine healing: Secondary | ICD-10-CM | POA: Diagnosis not present

## 2020-02-19 DIAGNOSIS — M25612 Stiffness of left shoulder, not elsewhere classified: Secondary | ICD-10-CM | POA: Diagnosis not present

## 2020-02-24 DIAGNOSIS — S42022D Displaced fracture of shaft of left clavicle, subsequent encounter for fracture with routine healing: Secondary | ICD-10-CM | POA: Diagnosis not present

## 2020-02-24 DIAGNOSIS — M6281 Muscle weakness (generalized): Secondary | ICD-10-CM | POA: Diagnosis not present

## 2020-02-24 DIAGNOSIS — M25512 Pain in left shoulder: Secondary | ICD-10-CM | POA: Diagnosis not present

## 2020-02-24 DIAGNOSIS — M25612 Stiffness of left shoulder, not elsewhere classified: Secondary | ICD-10-CM | POA: Diagnosis not present

## 2020-02-26 DIAGNOSIS — S42022D Displaced fracture of shaft of left clavicle, subsequent encounter for fracture with routine healing: Secondary | ICD-10-CM | POA: Diagnosis not present

## 2020-02-26 DIAGNOSIS — M6281 Muscle weakness (generalized): Secondary | ICD-10-CM | POA: Diagnosis not present

## 2020-02-26 DIAGNOSIS — M25612 Stiffness of left shoulder, not elsewhere classified: Secondary | ICD-10-CM | POA: Diagnosis not present

## 2020-02-26 DIAGNOSIS — M25512 Pain in left shoulder: Secondary | ICD-10-CM | POA: Diagnosis not present

## 2020-03-02 DIAGNOSIS — M6281 Muscle weakness (generalized): Secondary | ICD-10-CM | POA: Diagnosis not present

## 2020-03-02 DIAGNOSIS — M25512 Pain in left shoulder: Secondary | ICD-10-CM | POA: Diagnosis not present

## 2020-03-02 DIAGNOSIS — S42022D Displaced fracture of shaft of left clavicle, subsequent encounter for fracture with routine healing: Secondary | ICD-10-CM | POA: Diagnosis not present

## 2020-03-02 DIAGNOSIS — M25612 Stiffness of left shoulder, not elsewhere classified: Secondary | ICD-10-CM | POA: Diagnosis not present

## 2020-03-04 DIAGNOSIS — M25512 Pain in left shoulder: Secondary | ICD-10-CM | POA: Diagnosis not present

## 2020-03-04 DIAGNOSIS — M6281 Muscle weakness (generalized): Secondary | ICD-10-CM | POA: Diagnosis not present

## 2020-03-04 DIAGNOSIS — S42022D Displaced fracture of shaft of left clavicle, subsequent encounter for fracture with routine healing: Secondary | ICD-10-CM | POA: Diagnosis not present

## 2020-03-04 DIAGNOSIS — M25612 Stiffness of left shoulder, not elsewhere classified: Secondary | ICD-10-CM | POA: Diagnosis not present

## 2020-03-09 DIAGNOSIS — M6281 Muscle weakness (generalized): Secondary | ICD-10-CM | POA: Diagnosis not present

## 2020-03-09 DIAGNOSIS — S42022D Displaced fracture of shaft of left clavicle, subsequent encounter for fracture with routine healing: Secondary | ICD-10-CM | POA: Diagnosis not present

## 2020-03-09 DIAGNOSIS — M25612 Stiffness of left shoulder, not elsewhere classified: Secondary | ICD-10-CM | POA: Diagnosis not present

## 2020-03-09 DIAGNOSIS — M25512 Pain in left shoulder: Secondary | ICD-10-CM | POA: Diagnosis not present

## 2020-03-11 DIAGNOSIS — M25512 Pain in left shoulder: Secondary | ICD-10-CM | POA: Diagnosis not present

## 2020-03-11 DIAGNOSIS — M25612 Stiffness of left shoulder, not elsewhere classified: Secondary | ICD-10-CM | POA: Diagnosis not present

## 2020-03-11 DIAGNOSIS — S42022D Displaced fracture of shaft of left clavicle, subsequent encounter for fracture with routine healing: Secondary | ICD-10-CM | POA: Diagnosis not present

## 2020-03-11 DIAGNOSIS — M6281 Muscle weakness (generalized): Secondary | ICD-10-CM | POA: Diagnosis not present

## 2020-03-16 DIAGNOSIS — S42022D Displaced fracture of shaft of left clavicle, subsequent encounter for fracture with routine healing: Secondary | ICD-10-CM | POA: Diagnosis not present

## 2020-03-16 DIAGNOSIS — M25612 Stiffness of left shoulder, not elsewhere classified: Secondary | ICD-10-CM | POA: Diagnosis not present

## 2020-03-16 DIAGNOSIS — M25512 Pain in left shoulder: Secondary | ICD-10-CM | POA: Diagnosis not present

## 2020-03-16 DIAGNOSIS — M6281 Muscle weakness (generalized): Secondary | ICD-10-CM | POA: Diagnosis not present

## 2020-03-17 DIAGNOSIS — M25512 Pain in left shoulder: Secondary | ICD-10-CM | POA: Diagnosis not present

## 2020-03-18 DIAGNOSIS — S42022D Displaced fracture of shaft of left clavicle, subsequent encounter for fracture with routine healing: Secondary | ICD-10-CM | POA: Diagnosis not present

## 2020-03-18 DIAGNOSIS — M25612 Stiffness of left shoulder, not elsewhere classified: Secondary | ICD-10-CM | POA: Diagnosis not present

## 2020-03-18 DIAGNOSIS — M25512 Pain in left shoulder: Secondary | ICD-10-CM | POA: Diagnosis not present

## 2020-03-18 DIAGNOSIS — M6281 Muscle weakness (generalized): Secondary | ICD-10-CM | POA: Diagnosis not present

## 2020-03-24 DIAGNOSIS — S42022D Displaced fracture of shaft of left clavicle, subsequent encounter for fracture with routine healing: Secondary | ICD-10-CM | POA: Diagnosis not present

## 2020-03-24 DIAGNOSIS — M25612 Stiffness of left shoulder, not elsewhere classified: Secondary | ICD-10-CM | POA: Diagnosis not present

## 2020-03-24 DIAGNOSIS — M6281 Muscle weakness (generalized): Secondary | ICD-10-CM | POA: Diagnosis not present

## 2020-03-24 DIAGNOSIS — M25512 Pain in left shoulder: Secondary | ICD-10-CM | POA: Diagnosis not present

## 2020-03-26 DIAGNOSIS — S42022D Displaced fracture of shaft of left clavicle, subsequent encounter for fracture with routine healing: Secondary | ICD-10-CM | POA: Diagnosis not present

## 2020-03-26 DIAGNOSIS — M25512 Pain in left shoulder: Secondary | ICD-10-CM | POA: Diagnosis not present

## 2020-03-26 DIAGNOSIS — M6281 Muscle weakness (generalized): Secondary | ICD-10-CM | POA: Diagnosis not present

## 2020-03-26 DIAGNOSIS — M25612 Stiffness of left shoulder, not elsewhere classified: Secondary | ICD-10-CM | POA: Diagnosis not present

## 2020-06-09 DIAGNOSIS — R69 Illness, unspecified: Secondary | ICD-10-CM | POA: Diagnosis not present

## 2020-08-12 DIAGNOSIS — Z20822 Contact with and (suspected) exposure to covid-19: Secondary | ICD-10-CM | POA: Diagnosis not present

## 2020-08-21 DIAGNOSIS — J018 Other acute sinusitis: Secondary | ICD-10-CM | POA: Diagnosis not present

## 2020-08-21 DIAGNOSIS — E782 Mixed hyperlipidemia: Secondary | ICD-10-CM | POA: Diagnosis not present

## 2020-08-21 DIAGNOSIS — G250 Essential tremor: Secondary | ICD-10-CM | POA: Diagnosis not present

## 2020-08-21 DIAGNOSIS — I10 Essential (primary) hypertension: Secondary | ICD-10-CM | POA: Diagnosis not present

## 2020-08-21 DIAGNOSIS — E559 Vitamin D deficiency, unspecified: Secondary | ICD-10-CM | POA: Diagnosis not present

## 2020-08-21 DIAGNOSIS — E039 Hypothyroidism, unspecified: Secondary | ICD-10-CM | POA: Diagnosis not present

## 2020-08-21 DIAGNOSIS — J309 Allergic rhinitis, unspecified: Secondary | ICD-10-CM | POA: Diagnosis not present

## 2020-08-21 DIAGNOSIS — R413 Other amnesia: Secondary | ICD-10-CM | POA: Diagnosis not present

## 2020-08-21 DIAGNOSIS — D510 Vitamin B12 deficiency anemia due to intrinsic factor deficiency: Secondary | ICD-10-CM | POA: Diagnosis not present

## 2020-09-14 DIAGNOSIS — E039 Hypothyroidism, unspecified: Secondary | ICD-10-CM | POA: Diagnosis not present

## 2020-09-14 DIAGNOSIS — E782 Mixed hyperlipidemia: Secondary | ICD-10-CM | POA: Diagnosis not present

## 2020-09-14 DIAGNOSIS — D510 Vitamin B12 deficiency anemia due to intrinsic factor deficiency: Secondary | ICD-10-CM | POA: Diagnosis not present

## 2020-09-14 DIAGNOSIS — Z23 Encounter for immunization: Secondary | ICD-10-CM | POA: Diagnosis not present
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
## 2020-10-05 IMAGING — DX DG CHEST 1V PORT
1 series · 1 of 1 positions shown · non-contrast
Comparison: 12/30/2019

CLINICAL DATA: Chest tube removal.

EXAM:
PORTABLE CHEST 1 VIEW

[chest]
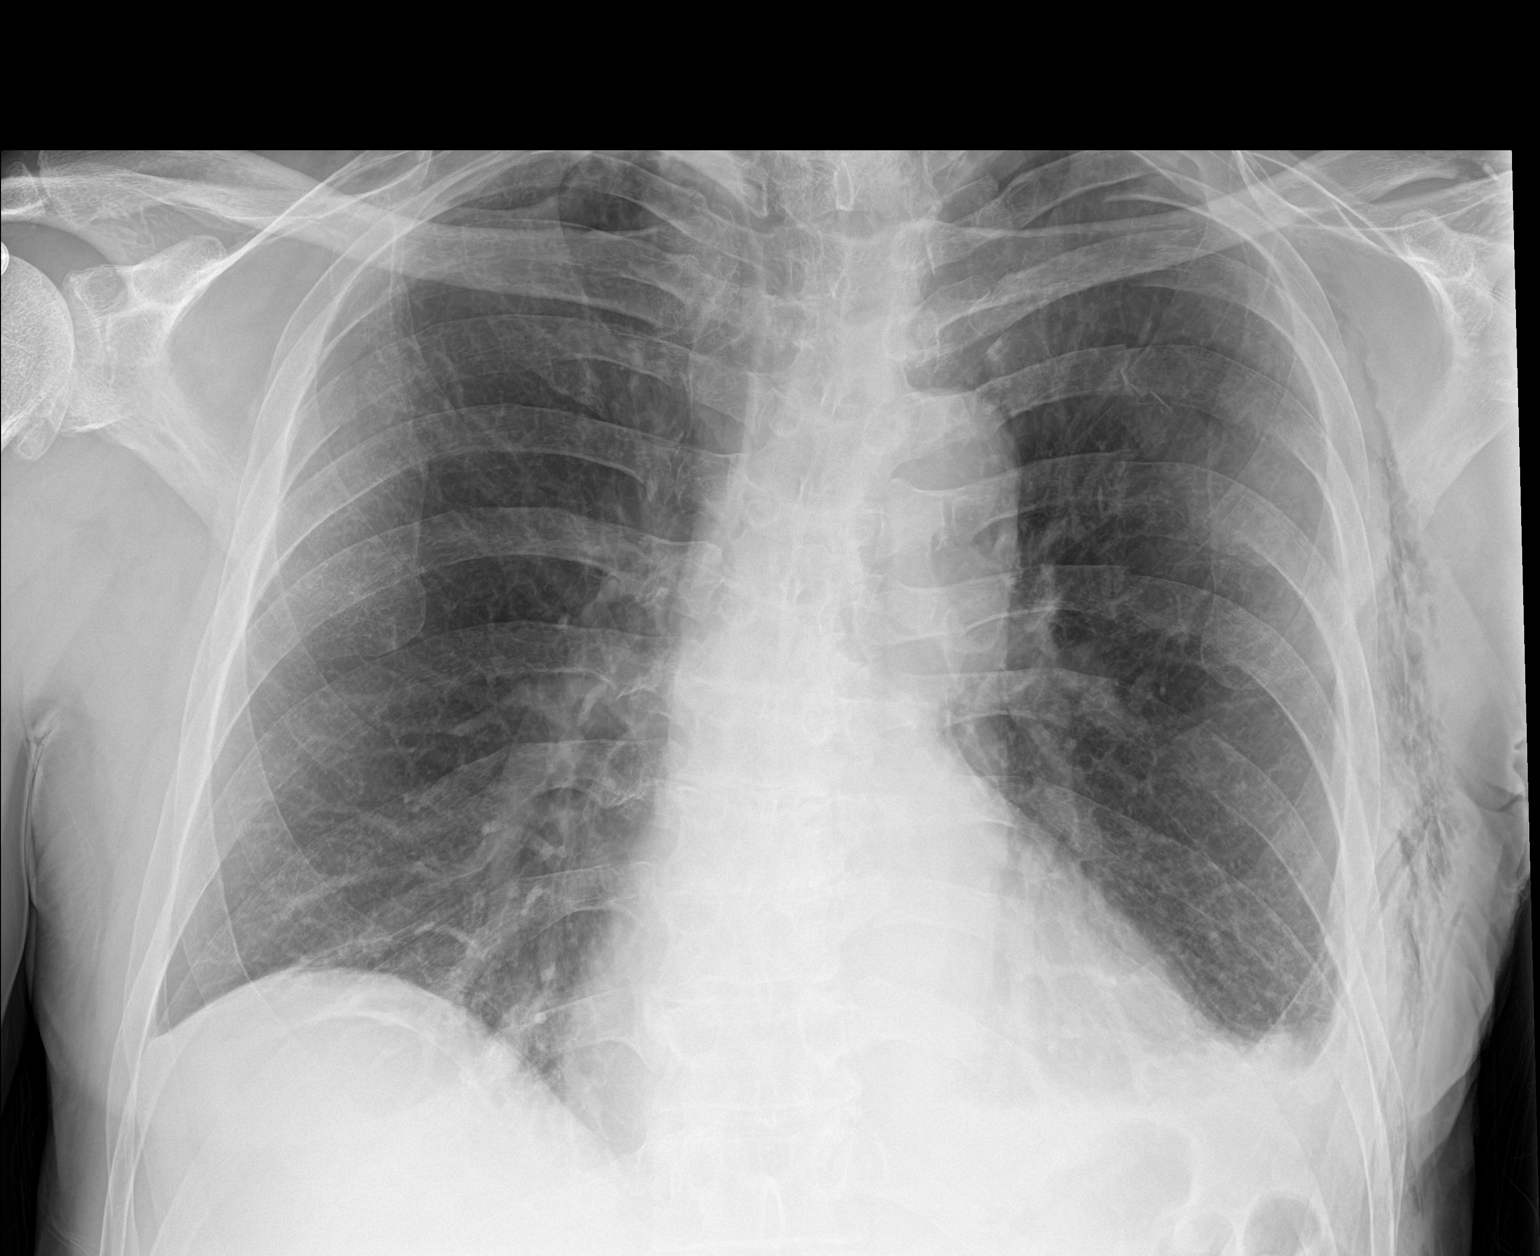

[1 of 1 positions shown; findings below may reference images not displayed]

FINDINGS: Interval removal of left-sided pigtail pleural drainage catheter.
Lungs are adequately inflated no evidence of residual left
pneumothorax. Small left pleural effusion with associated basilar
atelectasis unchanged. Right lung is clear. Cardiomediastinal
silhouette and remainder of the exam is unchanged including multiple
left-sided rib fractures.
IMPRESSION: Interval removal left-sided pigtail pleural drainage catheter. No
residual left pneumothorax. Small left pleural effusion with
associated basilar atelectasis unchanged. Multiple left rib
fractures.
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
## 2020-11-04 DIAGNOSIS — L84 Corns and callosities: Secondary | ICD-10-CM | POA: Diagnosis not present

## 2021-03-02 DIAGNOSIS — E039 Hypothyroidism, unspecified: Secondary | ICD-10-CM | POA: Diagnosis not present

## 2021-03-02 DIAGNOSIS — G4700 Insomnia, unspecified: Secondary | ICD-10-CM | POA: Diagnosis not present

## 2021-03-02 DIAGNOSIS — E782 Mixed hyperlipidemia: Secondary | ICD-10-CM | POA: Diagnosis not present

## 2021-03-02 DIAGNOSIS — D510 Vitamin B12 deficiency anemia due to intrinsic factor deficiency: Secondary | ICD-10-CM | POA: Diagnosis not present

## 2021-03-02 DIAGNOSIS — Z0000 Encounter for general adult medical examination without abnormal findings: Secondary | ICD-10-CM | POA: Diagnosis not present

## 2021-03-02 DIAGNOSIS — I10 Essential (primary) hypertension: Secondary | ICD-10-CM | POA: Diagnosis not present

## 2021-03-02 DIAGNOSIS — E559 Vitamin D deficiency, unspecified: Secondary | ICD-10-CM | POA: Diagnosis not present

## 2021-03-02 DIAGNOSIS — M25561 Pain in right knee: Secondary | ICD-10-CM | POA: Diagnosis not present

## 2021-03-02 DIAGNOSIS — M7071 Other bursitis of hip, right hip: Secondary | ICD-10-CM | POA: Diagnosis not present

## 2021-03-02 DIAGNOSIS — Z1389 Encounter for screening for other disorder: Secondary | ICD-10-CM | POA: Diagnosis not present

## 2021-03-02 DIAGNOSIS — Z1211 Encounter for screening for malignant neoplasm of colon: Secondary | ICD-10-CM | POA: Diagnosis not present

## 2021-03-04 DIAGNOSIS — Z1211 Encounter for screening for malignant neoplasm of colon: Secondary | ICD-10-CM | POA: Diagnosis not present

## 2021-09-02 DIAGNOSIS — G250 Essential tremor: Secondary | ICD-10-CM | POA: Diagnosis not present

## 2021-09-02 DIAGNOSIS — E559 Vitamin D deficiency, unspecified: Secondary | ICD-10-CM | POA: Diagnosis not present

## 2021-09-02 DIAGNOSIS — E039 Hypothyroidism, unspecified: Secondary | ICD-10-CM | POA: Diagnosis not present

## 2021-09-02 DIAGNOSIS — E782 Mixed hyperlipidemia: Secondary | ICD-10-CM | POA: Diagnosis not present

## 2021-09-02 DIAGNOSIS — I10 Essential (primary) hypertension: Secondary | ICD-10-CM | POA: Diagnosis not present

## 2021-09-02 DIAGNOSIS — Z23 Encounter for immunization: Secondary | ICD-10-CM | POA: Diagnosis not present

## 2021-09-02 DIAGNOSIS — J069 Acute upper respiratory infection, unspecified: Secondary | ICD-10-CM | POA: Diagnosis not present

## 2021-09-02 DIAGNOSIS — G4700 Insomnia, unspecified: Secondary | ICD-10-CM | POA: Diagnosis not present

## 2021-09-02 DIAGNOSIS — D510 Vitamin B12 deficiency anemia due to intrinsic factor deficiency: Secondary | ICD-10-CM | POA: Diagnosis not present

## 2021-09-02 DIAGNOSIS — J309 Allergic rhinitis, unspecified: Secondary | ICD-10-CM | POA: Diagnosis not present

## 2021-09-14 DIAGNOSIS — H903 Sensorineural hearing loss, bilateral: Secondary | ICD-10-CM | POA: Diagnosis not present

## 2021-09-14 DIAGNOSIS — E039 Hypothyroidism, unspecified: Secondary | ICD-10-CM | POA: Diagnosis not present

## 2021-09-14 DIAGNOSIS — Z570 Occupational exposure to noise: Secondary | ICD-10-CM | POA: Diagnosis not present

## 2021-11-08 DIAGNOSIS — H35372 Puckering of macula, left eye: Secondary | ICD-10-CM | POA: Diagnosis not present

## 2022-04-12 DIAGNOSIS — J309 Allergic rhinitis, unspecified: Secondary | ICD-10-CM | POA: Diagnosis not present

## 2022-04-12 DIAGNOSIS — Z1211 Encounter for screening for malignant neoplasm of colon: Secondary | ICD-10-CM | POA: Diagnosis not present

## 2022-04-12 DIAGNOSIS — G250 Essential tremor: Secondary | ICD-10-CM | POA: Diagnosis not present

## 2022-04-12 DIAGNOSIS — I10 Essential (primary) hypertension: Secondary | ICD-10-CM | POA: Diagnosis not present

## 2022-04-12 DIAGNOSIS — E782 Mixed hyperlipidemia: Secondary | ICD-10-CM | POA: Diagnosis not present

## 2022-04-12 DIAGNOSIS — E039 Hypothyroidism, unspecified: Secondary | ICD-10-CM | POA: Diagnosis not present

## 2022-04-12 DIAGNOSIS — E559 Vitamin D deficiency, unspecified: Secondary | ICD-10-CM | POA: Diagnosis not present

## 2022-04-12 DIAGNOSIS — Z1331 Encounter for screening for depression: Secondary | ICD-10-CM | POA: Diagnosis not present

## 2022-04-12 DIAGNOSIS — D510 Vitamin B12 deficiency anemia due to intrinsic factor deficiency: Secondary | ICD-10-CM | POA: Diagnosis not present

## 2022-04-12 DIAGNOSIS — M25552 Pain in left hip: Secondary | ICD-10-CM | POA: Diagnosis not present

## 2022-04-12 DIAGNOSIS — M25511 Pain in right shoulder: Secondary | ICD-10-CM | POA: Diagnosis not present

## 2022-04-12 DIAGNOSIS — Z0000 Encounter for general adult medical examination without abnormal findings: Secondary | ICD-10-CM | POA: Diagnosis not present

## 2022-05-26 DIAGNOSIS — E039 Hypothyroidism, unspecified: Secondary | ICD-10-CM | POA: Diagnosis not present

## 2022-10-11 DIAGNOSIS — Z23 Encounter for immunization: Secondary | ICD-10-CM | POA: Diagnosis not present

## 2022-10-11 DIAGNOSIS — D510 Vitamin B12 deficiency anemia due to intrinsic factor deficiency: Secondary | ICD-10-CM | POA: Diagnosis not present

## 2022-10-11 DIAGNOSIS — J309 Allergic rhinitis, unspecified: Secondary | ICD-10-CM | POA: Diagnosis not present

## 2022-10-11 DIAGNOSIS — G250 Essential tremor: Secondary | ICD-10-CM | POA: Diagnosis not present

## 2022-10-11 DIAGNOSIS — G4700 Insomnia, unspecified: Secondary | ICD-10-CM | POA: Diagnosis not present

## 2022-10-11 DIAGNOSIS — R69 Illness, unspecified: Secondary | ICD-10-CM | POA: Diagnosis not present

## 2022-10-11 DIAGNOSIS — E782 Mixed hyperlipidemia: Secondary | ICD-10-CM | POA: Diagnosis not present

## 2022-10-11 DIAGNOSIS — I10 Essential (primary) hypertension: Secondary | ICD-10-CM | POA: Diagnosis not present

## 2022-10-11 DIAGNOSIS — E039 Hypothyroidism, unspecified: Secondary | ICD-10-CM | POA: Diagnosis not present

## 2022-10-11 DIAGNOSIS — E559 Vitamin D deficiency, unspecified: Secondary | ICD-10-CM | POA: Diagnosis not present

## 2023-06-05 DIAGNOSIS — G250 Essential tremor: Secondary | ICD-10-CM | POA: Diagnosis not present

## 2023-06-05 DIAGNOSIS — D510 Vitamin B12 deficiency anemia due to intrinsic factor deficiency: Secondary | ICD-10-CM | POA: Diagnosis not present

## 2023-06-05 DIAGNOSIS — E039 Hypothyroidism, unspecified: Secondary | ICD-10-CM | POA: Diagnosis not present

## 2023-06-05 DIAGNOSIS — E559 Vitamin D deficiency, unspecified: Secondary | ICD-10-CM | POA: Diagnosis not present

## 2023-06-05 DIAGNOSIS — Z1331 Encounter for screening for depression: Secondary | ICD-10-CM | POA: Diagnosis not present

## 2023-06-05 DIAGNOSIS — Z125 Encounter for screening for malignant neoplasm of prostate: Secondary | ICD-10-CM | POA: Diagnosis not present

## 2023-06-05 DIAGNOSIS — Z1211 Encounter for screening for malignant neoplasm of colon: Secondary | ICD-10-CM | POA: Diagnosis not present

## 2023-06-05 DIAGNOSIS — Z0000 Encounter for general adult medical examination without abnormal findings: Secondary | ICD-10-CM | POA: Diagnosis not present

## 2023-06-05 DIAGNOSIS — M25552 Pain in left hip: Secondary | ICD-10-CM | POA: Diagnosis not present

## 2023-06-05 DIAGNOSIS — Z23 Encounter for immunization: Secondary | ICD-10-CM | POA: Diagnosis not present

## 2023-06-05 DIAGNOSIS — I10 Essential (primary) hypertension: Secondary | ICD-10-CM | POA: Diagnosis not present

## 2023-06-05 DIAGNOSIS — E782 Mixed hyperlipidemia: Secondary | ICD-10-CM | POA: Diagnosis not present

## 2023-06-14 DIAGNOSIS — Z1211 Encounter for screening for malignant neoplasm of colon: Secondary | ICD-10-CM | POA: Diagnosis not present

## 2023-12-08 DIAGNOSIS — E039 Hypothyroidism, unspecified: Secondary | ICD-10-CM | POA: Diagnosis not present

## 2023-12-08 DIAGNOSIS — J309 Allergic rhinitis, unspecified: Secondary | ICD-10-CM | POA: Diagnosis not present

## 2023-12-08 DIAGNOSIS — I10 Essential (primary) hypertension: Secondary | ICD-10-CM | POA: Diagnosis not present

## 2023-12-08 DIAGNOSIS — G250 Essential tremor: Secondary | ICD-10-CM | POA: Diagnosis not present

## 2023-12-08 DIAGNOSIS — E782 Mixed hyperlipidemia: Secondary | ICD-10-CM | POA: Diagnosis not present

## 2023-12-08 DIAGNOSIS — D510 Vitamin B12 deficiency anemia due to intrinsic factor deficiency: Secondary | ICD-10-CM | POA: Diagnosis not present

## 2023-12-08 DIAGNOSIS — E559 Vitamin D deficiency, unspecified: Secondary | ICD-10-CM | POA: Diagnosis not present

## 2023-12-08 DIAGNOSIS — G4700 Insomnia, unspecified: Secondary | ICD-10-CM | POA: Diagnosis not present

## 2024-07-04 DIAGNOSIS — G250 Essential tremor: Secondary | ICD-10-CM | POA: Diagnosis not present

## 2024-07-04 DIAGNOSIS — E039 Hypothyroidism, unspecified: Secondary | ICD-10-CM | POA: Diagnosis not present

## 2024-07-04 DIAGNOSIS — R351 Nocturia: Secondary | ICD-10-CM | POA: Diagnosis not present

## 2024-07-04 DIAGNOSIS — Z23 Encounter for immunization: Secondary | ICD-10-CM | POA: Diagnosis not present

## 2024-07-04 DIAGNOSIS — E782 Mixed hyperlipidemia: Secondary | ICD-10-CM | POA: Diagnosis not present

## 2024-07-04 DIAGNOSIS — D510 Vitamin B12 deficiency anemia due to intrinsic factor deficiency: Secondary | ICD-10-CM | POA: Diagnosis not present

## 2024-07-04 DIAGNOSIS — M25532 Pain in left wrist: Secondary | ICD-10-CM | POA: Diagnosis not present

## 2024-07-04 DIAGNOSIS — E559 Vitamin D deficiency, unspecified: Secondary | ICD-10-CM | POA: Diagnosis not present

## 2024-07-04 DIAGNOSIS — G4700 Insomnia, unspecified: Secondary | ICD-10-CM | POA: Diagnosis not present

## 2024-07-04 DIAGNOSIS — I10 Essential (primary) hypertension: Secondary | ICD-10-CM | POA: Diagnosis not present

## 2024-07-04 DIAGNOSIS — Z1331 Encounter for screening for depression: Secondary | ICD-10-CM | POA: Diagnosis not present

## 2024-07-04 DIAGNOSIS — Z0000 Encounter for general adult medical examination without abnormal findings: Secondary | ICD-10-CM | POA: Diagnosis not present
